# Patient Record
Sex: Male | Born: 1950 | Race: White | Hispanic: No | Marital: Single | Smoking: Never smoker
Health system: Southern US, Community
[De-identification: ages and names within clinical notes are randomized; demographics above are authoritative.]

## PROBLEM LIST (undated history)

## (undated) DIAGNOSIS — Z86010 Personal history of colonic polyps: Secondary | ICD-10-CM

## (undated) DIAGNOSIS — M1990 Unspecified osteoarthritis, unspecified site: Secondary | ICD-10-CM

## (undated) DIAGNOSIS — R05 Cough: Secondary | ICD-10-CM

## (undated) DIAGNOSIS — R059 Cough, unspecified: Secondary | ICD-10-CM

## (undated) DIAGNOSIS — K219 Gastro-esophageal reflux disease without esophagitis: Secondary | ICD-10-CM

## (undated) DIAGNOSIS — R0600 Dyspnea, unspecified: Secondary | ICD-10-CM

## (undated) HISTORY — DX: Cough, unspecified: R05.9

## (undated) HISTORY — DX: Personal history of colonic polyps: Z86.010

## (undated) HISTORY — DX: Unspecified osteoarthritis, unspecified site: M19.90

## (undated) HISTORY — DX: Dyspnea, unspecified: R06.00

## (undated) HISTORY — DX: Gastro-esophageal reflux disease without esophagitis: K21.9

## (undated) HISTORY — DX: Cough: R05

## (undated) HISTORY — PX: BACK SURGERY: SHX140

---

## 2009-03-07 ENCOUNTER — Emergency Department (HOSPITAL_BASED_OUTPATIENT_CLINIC_OR_DEPARTMENT_OTHER): Admission: EM | Admit: 2009-03-07 | Discharge: 2009-03-07 | Payer: Self-pay | Admitting: Emergency Medicine

## 2009-03-07 ENCOUNTER — Ambulatory Visit: Payer: Self-pay | Admitting: Diagnostic Radiology

## 2010-05-23 ENCOUNTER — Encounter (INDEPENDENT_AMBULATORY_CARE_PROVIDER_SITE_OTHER): Payer: Self-pay | Admitting: *Deleted

## 2010-05-23 LAB — CONVERTED CEMR LAB: PSA: 2.91 ng/mL (ref <=4.00)

## 2010-06-06 ENCOUNTER — Emergency Department (HOSPITAL_BASED_OUTPATIENT_CLINIC_OR_DEPARTMENT_OTHER)
Admission: EM | Admit: 2010-06-06 | Discharge: 2010-06-06 | Disposition: A | Discharge status: Home / Self Care | Payer: Self-pay | Attending: Emergency Medicine | Admitting: Emergency Medicine

## 2010-06-06 ENCOUNTER — Emergency Department (INDEPENDENT_AMBULATORY_CARE_PROVIDER_SITE_OTHER): Payer: Self-pay

## 2010-06-06 DIAGNOSIS — Y92410 Unspecified street and highway as the place of occurrence of the external cause: Secondary | ICD-10-CM | POA: Insufficient documentation

## 2010-06-06 DIAGNOSIS — R209 Unspecified disturbances of skin sensation: Secondary | ICD-10-CM | POA: Insufficient documentation

## 2010-06-06 DIAGNOSIS — E7800 Pure hypercholesterolemia, unspecified: Secondary | ICD-10-CM | POA: Insufficient documentation

## 2010-06-06 DIAGNOSIS — M542 Cervicalgia: Secondary | ICD-10-CM

## 2010-06-06 DIAGNOSIS — M47817 Spondylosis without myelopathy or radiculopathy, lumbosacral region: Secondary | ICD-10-CM | POA: Insufficient documentation

## 2010-06-06 DIAGNOSIS — M549 Dorsalgia, unspecified: Secondary | ICD-10-CM

## 2010-07-03 LAB — URINALYSIS, ROUTINE W REFLEX MICROSCOPIC
Bilirubin Urine: NEGATIVE
Glucose, UA: NEGATIVE mg/dL
Hgb urine dipstick: NEGATIVE
Ketones, ur: NEGATIVE mg/dL
Nitrite: NEGATIVE
Protein, ur: NEGATIVE mg/dL
Specific Gravity, Urine: 1.005 (ref 1.005–1.030)
Urobilinogen, UA: 0.2 mg/dL (ref 0.0–1.0)
pH: 6 (ref 5.0–8.0)

## 2010-08-24 ENCOUNTER — Emergency Department (INDEPENDENT_AMBULATORY_CARE_PROVIDER_SITE_OTHER): Payer: Self-pay

## 2010-08-24 ENCOUNTER — Emergency Department (HOSPITAL_BASED_OUTPATIENT_CLINIC_OR_DEPARTMENT_OTHER)
Admission: EM | Admit: 2010-08-24 | Discharge: 2010-08-24 | Disposition: A | Discharge status: Home / Self Care | Payer: Self-pay | Attending: Emergency Medicine | Admitting: Emergency Medicine

## 2010-08-24 DIAGNOSIS — R55 Syncope and collapse: Secondary | ICD-10-CM | POA: Insufficient documentation

## 2010-08-24 DIAGNOSIS — E7800 Pure hypercholesterolemia, unspecified: Secondary | ICD-10-CM | POA: Insufficient documentation

## 2010-08-24 DIAGNOSIS — G319 Degenerative disease of nervous system, unspecified: Secondary | ICD-10-CM | POA: Insufficient documentation

## 2010-08-24 DIAGNOSIS — H547 Unspecified visual loss: Secondary | ICD-10-CM

## 2010-08-24 DIAGNOSIS — R51 Headache: Secondary | ICD-10-CM

## 2010-08-24 DIAGNOSIS — K219 Gastro-esophageal reflux disease without esophagitis: Secondary | ICD-10-CM | POA: Insufficient documentation

## 2010-08-24 DIAGNOSIS — R42 Dizziness and giddiness: Secondary | ICD-10-CM | POA: Insufficient documentation

## 2010-08-24 LAB — GLUCOSE, CAPILLARY

## 2010-08-24 LAB — CBC
HCT: 42.7 % (ref 39.0–52.0)
Hemoglobin: 15.2 g/dL (ref 13.0–17.0)
MCV: 86.6 fL (ref 78.0–100.0)
WBC: 9.5 10*3/uL (ref 4.0–10.5)

## 2010-08-24 LAB — BASIC METABOLIC PANEL
BUN: 14 mg/dL (ref 6–23)
CO2: 25 mEq/L (ref 19–32)
Chloride: 102 mEq/L (ref 96–112)
Creatinine, Ser: 0.7 mg/dL (ref 0.4–1.5)
Glucose, Bld: 94 mg/dL (ref 70–99)
Potassium: 3.7 mEq/L (ref 3.5–5.1)

## 2010-08-24 LAB — DIFFERENTIAL
Basophils Absolute: 0 10*3/uL (ref 0.0–0.1)
Lymphocytes Relative: 23 % (ref 12–46)
Lymphs Abs: 2.2 10*3/uL (ref 0.7–4.0)
Neutro Abs: 6.2 10*3/uL (ref 1.7–7.7)

## 2011-03-30 ENCOUNTER — Emergency Department (HOSPITAL_BASED_OUTPATIENT_CLINIC_OR_DEPARTMENT_OTHER)
Admission: EM | Admit: 2011-03-30 | Discharge: 2011-03-30 | Disposition: A | Discharge status: Home / Self Care | Payer: Self-pay | Attending: Emergency Medicine | Admitting: Emergency Medicine

## 2011-03-30 ENCOUNTER — Encounter: Payer: Self-pay | Admitting: *Deleted

## 2011-03-30 DIAGNOSIS — K0889 Other specified disorders of teeth and supporting structures: Secondary | ICD-10-CM

## 2011-03-30 DIAGNOSIS — K089 Disorder of teeth and supporting structures, unspecified: Secondary | ICD-10-CM | POA: Insufficient documentation

## 2011-03-30 MED ORDER — PENICILLIN V POTASSIUM 250 MG PO TABS: 250.0000 mg | ORAL_TABLET | Freq: Four times a day (QID) | ORAL | Status: AC

## 2011-03-30 MED ORDER — HYDROCODONE-ACETAMINOPHEN 5-500 MG PO TABS: 1.0000 | ORAL_TABLET | Freq: Four times a day (QID) | ORAL | Status: AC | PRN

## 2011-03-30 NOTE — ED Notes (Signed)
Pt states  He has had dental pain x 4 weeks. Seen in [place]. Given Amox and Ibu. OK until last week and then s/s returned. Needs something until Jan 14th when he will have the tooth pulled.

## 2011-03-30 NOTE — ED Provider Notes (Signed)
History     CSN: [id number]  Arrival date & time 03/30/11  1315   First MD Initiated Contact with Patient 03/30/11 1416      Chief Complaint  Patient presents with  . Dental Pain    (Consider location/radiation/quality/duration/timing/severity/associated sxs/prior treatment) HPI Comments: Pt states that he had pain, and was treated but in the last week the pain started to come back  Patient is a 60 y.o. male presenting with tooth pain. The history is provided by the patient. No language interpreter was used.  Dental PainThe primary symptoms include mouth pain. The symptoms began more than 1 week ago. The symptoms are worsening. The symptoms are recurrent. The symptoms occur constantly.  Additional symptoms include: dental sensitivity to temperature. Additional symptoms do not include: gum swelling, facial swelling and trouble swallowing.    History reviewed. No pertinent past medical history.  History reviewed. No pertinent past surgical history.  History reviewed. No pertinent family history.  History  Substance Use Topics  . Smoking status: Never Smoker   . Smokeless tobacco: Not on file  . Alcohol Use: No      Review of Systems  Constitutional: Negative.   HENT: Negative for facial swelling and trouble swallowing.   Respiratory: Negative.   Cardiovascular: Negative.     Allergies  Review of patient's allergies indicates no known allergies.  Home Medications   Current Outpatient Rx  Name Route Sig Dispense Refill  . HYDROCODONE-ACETAMINOPHEN 5-500 MG PO TABS Oral Take 1-2 tablets by mouth every 6 (six) hours as needed for pain. 6 tablet 0  . PENICILLIN V POTASSIUM 250 MG PO TABS Oral Take 1 tablet (250 mg total) by mouth 4 (four) times daily. 40 tablet 0    BP 128/81  Pulse 103  Temp(Src) 97.4 F (36.3 C) (Oral)  Resp 18  Ht 5\' 11"  (1.803 m)  Wt 210 lb (95.255 kg)  BMI 29.29 kg/m2  SpO2 99%  Physical Exam  Nursing note and vitals  reviewed. Constitutional: He appears well-developed and well-nourished.  HENT:  Head: Normocephalic and atraumatic.  Right Ear: External ear normal.  Left Ear: External ear normal.       Pt has multiple decayed teeth without and gross swelling  Cardiovascular: Normal rate and regular rhythm.   Pulmonary/Chest: Effort normal and breath sounds normal.    ED Course  Procedures (including critical care time)  Labs Reviewed - No data to display No results found.   1. Toothache       MDM  Pt has a schedule appt on jan 14 with the dentist to have the tooth extracted        Teressa Lower, NP 03/30/11 1501

## 2011-03-30 NOTE — ED Notes (Signed)
rx x 2 for hydrocodone and penicillin given to pt

## 2011-03-31 NOTE — ED Provider Notes (Signed)
Medical screening examination/treatment/procedure(s) were performed by non-physician practitioner and as supervising physician I was immediately available for consultation/collaboration.  Kensington Rios, MD 03/31/11 2211 

## 2011-11-25 ENCOUNTER — Emergency Department (HOSPITAL_BASED_OUTPATIENT_CLINIC_OR_DEPARTMENT_OTHER)
Admission: EM | Admit: 2011-11-25 | Discharge: 2011-11-25 | Disposition: A | Discharge status: Home / Self Care | Payer: Self-pay | Attending: Emergency Medicine | Admitting: Emergency Medicine

## 2011-11-25 ENCOUNTER — Encounter (HOSPITAL_BASED_OUTPATIENT_CLINIC_OR_DEPARTMENT_OTHER): Payer: Self-pay | Admitting: *Deleted

## 2011-11-25 DIAGNOSIS — J9801 Acute bronchospasm: Secondary | ICD-10-CM | POA: Insufficient documentation

## 2011-11-25 MED ORDER — ALBUTEROL SULFATE HFA 108 (90 BASE) MCG/ACT IN AERS
2.0000 | INHALATION_SPRAY | RESPIRATORY_TRACT | Status: DC | PRN
Start: 2011-11-25 — End: 2011-11-25
  Administered 2011-11-25: 2 via RESPIRATORY_TRACT
  Filled 2011-11-25: qty 6.7

## 2011-11-25 NOTE — ED Provider Notes (Signed)
History     CSN: [id number]  Arrival date & time 11/25/11  1412   First MD Initiated Contact with Patient 11/25/11 1531      Chief Complaint  Patient presents with  . Cough    (Consider location/radiation/quality/duration/timing/severity/associated sxs/prior treatment) Patient is a 61 y.o. male presenting with cough. The history is provided by the patient.  Cough This is a chronic problem. The current episode started more than 1 week ago. The problem has not changed since onset.Associated symptoms include wheezing. Pertinent negatives include no chest pain, no sweats, no headaches and no shortness of breath. Associated symptoms comments: He reports wheezing every night over the last  2months with infrequent cough. It resolved during the day. It is not associated with chest pain. He does not report and shortness of breath. It happens when he is recumbent at night and he does not notice it during the day.. His past medical history does not include asthma.    History reviewed. No pertinent past medical history.  History reviewed. No pertinent past surgical history.  History reviewed. No pertinent family history.  History  Substance Use Topics  . Smoking status: Never Smoker   . Smokeless tobacco: Not on file  . Alcohol Use: No      Review of Systems  Constitutional: Negative for fever.  HENT: Negative for congestion.   Respiratory: Positive for cough and wheezing. Negative for shortness of breath.   Cardiovascular: Negative for chest pain.  Gastrointestinal: Negative for nausea and vomiting.  Skin: Negative for rash.  Neurological: Negative for headaches.    Allergies  Review of patient's allergies indicates no known allergies.  Home Medications  No current outpatient prescriptions on file.  BP 113/66  Pulse 84  Temp 98 F (36.7 C) (Oral)  Resp 18  Ht 6' (1.829 m)  Wt 228 lb (103.42 kg)  BMI 30.92 kg/m2  SpO2 98%  Physical Exam  Constitutional: He appears  well-developed and well-nourished.  HENT:  Head: Normocephalic.  Neck: Normal range of motion. Neck supple.  Cardiovascular: Normal rate and regular rhythm.   Pulmonary/Chest: Effort normal and breath sounds normal.  Abdominal: Soft. Bowel sounds are normal. There is no tenderness. There is no rebound and no guarding.  Musculoskeletal: Normal range of motion.  Neurological: He is alert. No cranial nerve deficit.  Skin: Skin is warm and dry. No rash noted.  Psychiatric: He has a normal mood and affect.    ED Course  Procedures (including critical care time)  Labs Reviewed - No data to display No results found.   No diagnosis found.  1. Bronchospasm   MDM  Patient without fever, pain, substantial cough with audible wheezing only at night - suspect uncomplicated bronchospasm. Will treat with inhaler, but discussed that he should return when symptomatic if symptoms persist.         Rodena Medin, PA-C 11/25/11 1546

## 2011-11-25 NOTE — ED Provider Notes (Signed)
Medical screening examination/treatment/procedure(s) were performed by non-physician practitioner and as supervising physician I was immediately available for consultation/collaboration.   Raya Mckinstry B. Gio Janoski, MD 11/25/11 1639 

## 2011-11-25 NOTE — ED Notes (Signed)
Pt amb to triage with quick steady gait in nad. Pt reports 4 months of intermittent cough, and feeling "wheezy and short of breath at night sometimes." denies any pain or other c/o.

## 2012-07-24 ENCOUNTER — Emergency Department (HOSPITAL_COMMUNITY)
Admission: EM | Admit: 2012-07-24 | Discharge: 2012-07-24 | Disposition: A | Discharge status: Home Health | Payer: Self-pay | Source: Home / Self Care | Attending: Family Medicine | Admitting: Family Medicine

## 2012-07-24 ENCOUNTER — Encounter (HOSPITAL_COMMUNITY): Payer: Self-pay

## 2012-07-24 DIAGNOSIS — M542 Cervicalgia: Secondary | ICD-10-CM

## 2012-07-24 DIAGNOSIS — M62838 Other muscle spasm: Secondary | ICD-10-CM | POA: Diagnosis present

## 2012-07-24 DIAGNOSIS — M5380 Other specified dorsopathies, site unspecified: Secondary | ICD-10-CM

## 2012-07-24 DIAGNOSIS — M62830 Muscle spasm of back: Secondary | ICD-10-CM | POA: Diagnosis present

## 2012-07-24 MED ORDER — CYCLOBENZAPRINE HCL 5 MG PO TABS: 5.0000 mg | ORAL_TABLET | Freq: Every evening | ORAL | Status: DC | PRN

## 2012-07-24 MED ORDER — TRAMADOL HCL 50 MG PO TABS: 50.0000 mg | ORAL_TABLET | Freq: Two times a day (BID) | ORAL | Status: DC | PRN

## 2012-07-24 MED ORDER — NAPROXEN 500 MG PO TABS: ORAL_TABLET | ORAL | Status: DC

## 2012-07-24 NOTE — ED Provider Notes (Signed)
History     CSN: [id number]  Arrival date & time 07/24/12  1554   First MD Initiated Contact with Patient 07/24/12 1634      Chief Complaint  Patient presents with  . Back Pain  . Neck Pain    (Consider location/radiation/quality/duration/timing/severity/associated sxs/prior treatment) HPI Pt reports that for the past several weeks he has been having pain in the back of the neck and shoulder and upper back.   Pt says that he has been having some discomfort in the hands in the morning but then it gets better after movement.   Pt has not been having problems in the lower back.  Pt has been taking Advil over the counter.    History reviewed. No pertinent past medical history.  History reviewed. No pertinent past surgical history.  No family history on file.  History  Substance Use Topics  . Smoking status: Never Smoker   . Smokeless tobacco: Not on file  . Alcohol Use: No    Review of Systems Constitutional: Negative.  HENT: Negative.  Respiratory: Negative.  Cardiovascular: Negative.  Gastrointestinal: Negative.  Endocrine: Negative.  Genitourinary: Negative.  Musculoskeletal: back spasms and back pain , joint pain in fingers  Skin: Negative.  Allergic/Immunologic: Negative.  Neurological: Negative.  Hematological: Negative.  Psychiatric/Behavioral: Negative.  All other systems reviewed and are negative  Allergies  Review of patient's allergies indicates no known allergies.  Home Medications  No current outpatient prescriptions on file.  BP 130/80  Pulse 63  Temp(Src) 97.8 F (36.6 C) (Oral)  Wt 247 lb (112.038 kg)  BMI 33.49 kg/m2  SpO2 98%  Physical Exam Nursing note and vitals reviewed.  Constitutional: He is oriented to person, place, and time. He appears well-developed and well-nourished. No distress.  Eyes: Conjunctivae and EOM are normal. Pupils are equal, round, and reactive to light.  Neck: Normal range of motion. Neck supple. No JVD present.  No thyromegaly present.  Cardiovascular: Normal rate, regular rhythm and normal heart sounds.  No murmur heard.  Pulmonary/Chest: Effort normal and breath sounds normal. No respiratory distress.  Abdominal: Soft. Bowel sounds are normal.  Musculoskeletal: tenderness of paraspinal muscles of back, no loss of motion noted, no abnormal curvature of the spine  Normal range of motion. He exhibits no edema.  Lymphadenopathy:  He has no cervical adenopathy.  Neurological: He is oriented to person, place, and time. Coordination normal.  Skin: Skin is warm and dry. No rash noted. No erythema. No pallor.  Psychiatric: He has a normal mood and affect. His behavior is normal. Judgment and thought content normal.   ED Course  Procedures (including critical care time)  Labs Reviewed - No data to display No results found.  No diagnosis found.  MDM  IMPRESSION  Back pain   Back spasm  Osteoarthritis   GERD  RECOMMENDATIONS / PLAN Early activity encouraged, avoid reinjury, trial of cyclobenzaprine 5 mg po every HS, tramadol 50 mg po every 12 hours prn severe pain, naproxen 500 mg every 12 hours prn severe pain  FOLLOW UP 3 weeks   The patient was given clear instructions to go to ER or return to medical center if symptoms don't improve, worsen or new problems develop.  The patient verbalized understanding.  The patient was told to call to get lab results if they haven't heard anything in the next week.           Cleora Fleet, MD 07/24/12 [phone]

## 2012-07-24 NOTE — ED Notes (Signed)
Patient states suffers from back pain-radiates down neck shoulders and lower back Denies any injury Had back surgery in the past

## 2012-09-08 ENCOUNTER — Ambulatory Visit: Payer: No Typology Code available for payment source | Attending: Family Medicine | Admitting: Internal Medicine

## 2012-09-08 VITALS — BP 120/80 | HR 82 | Temp 98.7°F | Resp 16 | Ht 70.0 in | Wt 244.0 lb

## 2012-09-08 DIAGNOSIS — M5380 Other specified dorsopathies, site unspecified: Secondary | ICD-10-CM

## 2012-09-08 DIAGNOSIS — M129 Arthropathy, unspecified: Secondary | ICD-10-CM

## 2012-09-08 DIAGNOSIS — M62838 Other muscle spasm: Secondary | ICD-10-CM

## 2012-09-08 DIAGNOSIS — M1990 Unspecified osteoarthritis, unspecified site: Secondary | ICD-10-CM | POA: Insufficient documentation

## 2012-09-08 DIAGNOSIS — M62830 Muscle spasm of back: Secondary | ICD-10-CM

## 2012-09-08 DIAGNOSIS — M542 Cervicalgia: Secondary | ICD-10-CM

## 2012-09-08 MED ORDER — OXYCODONE-ACETAMINOPHEN 5-325 MG PO TABS: 1.0000 | ORAL_TABLET | Freq: Three times a day (TID) | ORAL | Status: DC | PRN

## 2012-09-08 MED ORDER — CYCLOBENZAPRINE HCL 5 MG PO TABS: 5.0000 mg | ORAL_TABLET | Freq: Every evening | ORAL | Status: DC | PRN

## 2012-09-08 NOTE — Patient Instructions (Addendum)

## 2012-09-08 NOTE — Progress Notes (Signed)
Patient ID: Keith Bruce, male   DOB: Aug 14, 1950, 62 y.o.   MRN: [id number]  CC: Joint pain  HPI: 62 year old male with no significant past medical history other than back pain and muscle spasms related to back pain who presents to our clinic with complaints of pain in ankle and wrist joints, chronic for about 6 months now. Patient reports experiencing stiffness in the morning, pain in bilateral wrists, bilateral ankles, back and neck. Patient reports that with activity throughout the day this gets better. He takes tramadol with no symptomatic relief. He has a family history of arthritis. No chest pain, no shortness of breath, no palpitation. No abdominal pain, no nausea or vomiting.  No Known Allergies History reviewed. No pertinent past medical history. No current outpatient prescriptions on file prior to visit.   No current facility-administered medications on file prior to visit.   Family History  Problem Relation Age of Onset  . Heart disease Mother     Sister - arthritis, severe    . Kidney disease Father    History   Social History  . Marital Status: Divorced    Spouse Name: N/A    Number of Children: N/A  . Years of Education: N/A   Occupational History  . Not on file.   Social History Main Topics  . Smoking status: Never Smoker   . Smokeless tobacco: Not on file  . Alcohol Use: No  . Drug Use: No  . Sexually Active: Not on file   Other Topics Concern  . Not on file   Social History Narrative  . No narrative on file    Review of Systems  Constitutional: Negative for fever, chills, diaphoresis, activity change, appetite change and fatigue.  HENT: Negative for ear pain, nosebleeds, congestion, facial swelling, rhinorrhea, neck pain, neck stiffness and ear discharge.   Eyes: Negative for pain, discharge, redness, itching and visual disturbance.  Respiratory: Negative for cough, choking, chest tightness, shortness of breath, wheezing and stridor.   Cardiovascular:  Negative for chest pain, palpitations and leg swelling.  Gastrointestinal: Negative for abdominal distention.  Genitourinary: Negative for dysuria, urgency, frequency, hematuria, flank pain, decreased urine volume, difficulty urinating and dyspareunia.  Musculoskeletal: Positive for pain in joints, stiffness.  Neurological: Negative for dizziness, tremors, seizures, syncope, facial asymmetry, speech difficulty, weakness, light-headedness, numbness and headaches.  Hematological: Negative for adenopathy. Does not bruise/bleed easily.  Psychiatric/Behavioral: Negative for hallucinations, behavioral problems, confusion, dysphoric mood, decreased concentration and agitation.    Objective:   Filed Vitals:   09/08/12 1106  BP: 120/80  Pulse: 82  Temp: 98.7 F (37.1 C)  Resp: 16    Physical Exam  Constitutional: Appears well-developed and well-nourished. No distress.  HENT: Normocephalic. External right and left ear normal. Oropharynx is clear and moist.  Eyes: Conjunctivae and EOM are normal. PERRLA, no scleral icterus.  Neck: Normal ROM. Neck supple. No JVD. No tracheal deviation. No thyromegaly.  CVS: RRR, S1/S2 +, no murmurs, no gallops, no carotid bruit.  Pulmonary: Effort and breath sounds normal, no stridor, rhonchi, wheezes, rales.  Abdominal: Soft. BS +,  no distension, tenderness, rebound or guarding.  Musculoskeletal: Normal range of motion. No edema and no tenderness.  Lymphadenopathy: No lymphadenopathy noted, cervical, inguinal. Neuro: Alert. Normal reflexes, muscle tone coordination. No cranial nerve deficit. Skin: Skin is warm and dry. No rash noted. Not diaphoretic. No erythema. No pallor.  Psychiatric: Normal mood and affect. Behavior, judgment, thought content normal.   Lab Results  Component Value Date  WBC 9.5 08/24/2010   HGB 15.2 08/24/2010   HCT 42.7 08/24/2010   MCV 86.6 08/24/2010   PLT 222 08/24/2010   Lab Results  Component Value Date   CREATININE 0.70  08/24/2010   BUN 14 08/24/2010   NA 139 08/24/2010   K 3.7 08/24/2010   CL 102 08/24/2010   CO2 25 08/24/2010    No results found for this basename: HGBA1C   Lipid Panel  No results found for this basename: chol, trig, hdl, cholhdl, vldl, ldlcalc      Assessment and plan:   Patient Active Problem List   Diagnosis Date Noted  . Arthritis 09/08/2012    Priority: Medium - Patient reports tramadol provides only minimal symptomatic relief. We'll try Percocet and see if this improves symptoms   . Muscle spasm 07/24/2012    Priority: Medium - Continue Flexeril

## 2012-09-08 NOTE — Progress Notes (Signed)
Patient complains of bilateral hand pain in joints; stiffness and numbness at night; also pain and stiffness in ankles. Pt is concerned that he has arthritis.

## 2012-10-07 ENCOUNTER — Encounter: Payer: Self-pay | Admitting: Family Medicine

## 2012-10-07 ENCOUNTER — Ambulatory Visit: Payer: No Typology Code available for payment source | Attending: Family Medicine | Admitting: Family Medicine

## 2012-10-07 VITALS — BP 144/92 | HR 56 | Temp 98.7°F | Resp 16 | Ht 69.5 in | Wt 238.0 lb

## 2012-10-07 DIAGNOSIS — M25519 Pain in unspecified shoulder: Secondary | ICD-10-CM

## 2012-10-07 DIAGNOSIS — M5380 Other specified dorsopathies, site unspecified: Secondary | ICD-10-CM | POA: Insufficient documentation

## 2012-10-07 DIAGNOSIS — M129 Arthropathy, unspecified: Secondary | ICD-10-CM | POA: Insufficient documentation

## 2012-10-07 DIAGNOSIS — M542 Cervicalgia: Secondary | ICD-10-CM | POA: Insufficient documentation

## 2012-10-07 DIAGNOSIS — M25511 Pain in right shoulder: Secondary | ICD-10-CM

## 2012-10-07 DIAGNOSIS — Z0000 Encounter for general adult medical examination without abnormal findings: Secondary | ICD-10-CM

## 2012-10-07 DIAGNOSIS — E291 Testicular hypofunction: Secondary | ICD-10-CM

## 2012-10-07 LAB — POCT URINALYSIS DIPSTICK
Bilirubin, UA: NEGATIVE
Blood, UA: NEGATIVE
Glucose, UA: NEGATIVE
Ketones, UA: NEGATIVE
Leukocytes, UA: NEGATIVE
Nitrite, UA: NEGATIVE

## 2012-10-07 LAB — LIPID PANEL
Cholesterol: 236 mg/dL — ABNORMAL HIGH (ref 0–200)
Total CHOL/HDL Ratio: 4.9 Ratio
Triglycerides: 146 mg/dL (ref <150)

## 2012-10-07 LAB — CBC
HCT: 45.9 % (ref 39.0–52.0)
Hemoglobin: 15.9 g/dL (ref 13.0–17.0)
MCH: 30.2 pg (ref 26.0–34.0)
MCHC: 34.6 g/dL (ref 30.0–36.0)
MCV: 87.1 fL (ref 78.0–100.0)

## 2012-10-07 LAB — TESTOSTERONE: Testosterone: 577 ng/dL (ref 300–890)

## 2012-10-07 NOTE — Progress Notes (Signed)
Patient states he is here for yearly physical.

## 2012-10-07 NOTE — Patient Instructions (Addendum)
Shoulder Pain  The shoulder is the joint that connects your arm to your body. Muscles and band-like tissues that connect bones to muscles (tendons) hold the joint together. Shoulder pain is felt if an injury or medical problem affects one or more parts of the shoulder.  HOME CARE    Put ice on the sore area.   Put ice in a plastic bag.   Place a towel between your skin and the bag.   Leave the ice on for 15-20 minutes, 3-4 times a day for the first 2 days.   Stop using cold packs if they do not help with the pain.   If you were given something to keep your shoulder from moving (sling, shoulder immobilizer), wear it as told. Only take it off to shower or bathe.   Move your arm as little as possible, but keep your hand moving to prevent puffiness (swelling).   Squeeze a soft ball or foam pad as much as possible to help prevent swelling.   Take medicine as told by your doctor.  GET HELP RIGHT AWAY IF:    Your arm, hand, or fingers are numb or tingling.   Your arm, hand, or fingers are puffy (swollen), painful, or turn white or blue.   You have more pain.   You have progressing new pain in your arm, hand, or fingers.   Your hand or fingers get cold.   Your medicine does not help lessen your pain.  MAKE SURE YOU:    Understand these instructions.   Will watch your condition.   Will get help right away if you are not doing well or get worse.  Document Released: 09/04/2007 Document Revised: 12/11/2011 Document Reviewed: 09/30/2011  [employer] Patient Information 2014 [employer].

## 2012-10-07 NOTE — Progress Notes (Signed)
Patient ID: Keith Bruce, male   DOB: Jun 08, 1950, 62 y.o.   MRN: [id number]  CC: complete physical examination   HPI: Pt says that he has had some erection difficulties.  He has been having some problems with his right shoulder for the last 6 months and the medications haven't been helping much.  Pt says that he otherwise feels well.  He has not had a colonoscopy for about 10 years now.  TDAP is up to date.  Had one 3 years ago.   No Known Allergies History reviewed. No pertinent past medical history. Current Outpatient Prescriptions on File Prior to Visit  Medication Sig Dispense Refill  . cyclobenzaprine (FLEXERIL) 5 MG tablet Take 1 tablet (5 mg total) by mouth at bedtime as needed for muscle spasms.  30 tablet  3   No current facility-administered medications on file prior to visit.   Family History  Problem Relation Age of Onset  . Heart disease Mother   . Kidney disease Father    History   Social History  . Marital Status: Divorced    Spouse Name: N/A    Number of Children: N/A  . Years of Education: N/A   Occupational History  . Not on file.   Social History Main Topics  . Smoking status: Never Smoker   . Smokeless tobacco: Not on file  . Alcohol Use: No  . Drug Use: No  . Sexually Active: Not on file   Other Topics Concern  . Not on file   Social History Narrative  . No narrative on file    Review of Systems  Constitutional: Negative for fever, chills, diaphoresis, activity change, appetite change and fatigue.  HENT: Negative for ear pain, nosebleeds, congestion, facial swelling, rhinorrhea, neck pain, neck stiffness and ear discharge.   Eyes: Negative for pain, discharge, redness, itching and visual disturbance.  Respiratory: Negative for cough, choking, chest tightness, shortness of breath, wheezing and stridor.   Cardiovascular: Negative for chest pain, palpitations and leg swelling.  Gastrointestinal: Negative for abdominal distention.  Genitourinary:  Negative for dysuria, urgency, frequency, hematuria, flank pain, decreased urine volume, difficulty urinating and dyspareunia.  Musculoskeletal: Negative for back pain, joint swelling, arthralgias and gait problem.  Neurological: Negative for dizziness, tremors, seizures, syncope, facial asymmetry, speech difficulty, weakness, light-headedness, numbness and headaches.  Hematological: Negative for adenopathy. Does not bruise/bleed easily.  Psychiatric/Behavioral: Negative for hallucinations, behavioral problems, confusion, dysphoric mood, decreased concentration and agitation.    Objective:   Filed Vitals:   10/07/12 1238  BP: 144/92  Pulse: 56  Temp: 98.7 F (37.1 C)  Resp: 16    Physical Exam  Constitutional: Appears well-developed and well-nourished. No distress.  HENT: Normocephalic. External right and left ear normal. Oropharynx is clear and moist.  Eyes: Conjunctivae and EOM are normal. PERRLA, no scleral icterus.  Neck: Normal ROM. Neck supple. No JVD. No tracheal deviation. No thyromegaly.  CVS: RRR, S1/S2 +, no murmurs, no gallops, no carotid bruit.  Pulmonary: Effort and breath sounds normal, no stridor, rhonchi, wheezes, rales.  Abdominal: Soft. BS +,  no distension, tenderness, rebound or guarding. Mildy enlarged prostate.   Musculoskeletal: Normal range of motion. No edema and no tenderness.  Lymphadenopathy: No lymphadenopathy noted, cervical, inguinal. Neuro: Alert. Normal reflexes, muscle tone coordination. No cranial nerve deficit. Skin: Skin is warm and dry. No rash noted. Not diaphoretic. No erythema. No pallor.  Psychiatric: Normal mood and affect. Behavior, judgment, thought content normal.   Lab Results  Component Value  Date   WBC 9.5 08/24/2010   HGB 15.2 08/24/2010   HCT 42.7 08/24/2010   MCV 86.6 08/24/2010   PLT 222 08/24/2010   Lab Results  Component Value Date   CREATININE 0.70 08/24/2010   BUN 14 08/24/2010   NA 139 08/24/2010   K 3.7 08/24/2010   CL  102 08/24/2010   CO2 25 08/24/2010    No results found for this basename: HGBA1C   Lipid Panel  No results found for this basename: chol, trig, hdl, cholhdl, vldl, ldlcalc       Assessment and plan:   Patient Active Problem List   Diagnosis Date Noted  . Health maintenance examination 10/07/2012  . Hypogonadism male 10/07/2012  . Right shoulder pain 10/07/2012  . Arthritis 09/08/2012  . Muscle spasm 07/24/2012  . Back spasm 07/24/2012  . Neck pain 07/24/2012      CPE   Check testosterone levels  Check labs today  Follow lab results  Screening colonoscopy ordered.  Pt was sent home with hemoccult cards and asked to return to clinic.   Follow up in 6 months  The patient was given clear instructions to go to ER or return to medical center if symptoms don't improve, worsen or new problems develop.  The patient verbalized understanding.  The patient was told to call to get any lab results if not heard anything in the next week.    Rodney Langton, MD, CDE, FAAFP [facility] [place], [place]

## 2012-10-08 LAB — COMPLETE METABOLIC PANEL WITH GFR
Albumin: 4.6 g/dL (ref 3.5–5.2)
Alkaline Phosphatase: 107 U/L (ref 39–117)
BUN: 20 mg/dL (ref 6–23)
Creat: 0.92 mg/dL (ref 0.50–1.35)
GFR, Est Non African American: 89 mL/min
Glucose, Bld: 105 mg/dL — ABNORMAL HIGH (ref 70–99)
Potassium: 4.8 mEq/L (ref 3.5–5.3)

## 2012-10-09 ENCOUNTER — Telehealth: Payer: Self-pay

## 2012-10-09 NOTE — Telephone Encounter (Signed)
Left messages at both numbers to return our call

## 2012-10-09 NOTE — Progress Notes (Signed)
Quick Note:  Please inform patient that all of his labs came back okay septum his cholesterol levels were elevated. I'm recommending that he start a low-fat low-cholesterol diet and exercise 5 times per week. We should recheck his cholesterol levels in 3 or 4 months. If they're still elevated we may consider some cholesterol lowering medication at that point. His testosterone levels came back within normal limits.   Rodney Langton, MD, CDE, FAAFP [facility] [place], [place]   ______

## 2012-10-09 NOTE — Telephone Encounter (Signed)
Message copied by Lestine Mount on Fri Oct 09, 2012 10:52 AM ------      Message from: Cleora Fleet      Created: Fri Oct 09, 2012  9:27 AM       Please inform patient that all of his labs came back okay septum his cholesterol levels were elevated.  I'm recommending that he start a low-fat low-cholesterol diet and exercise 5 times per week.  We should recheck his cholesterol levels in 3 or 4 months.  If they're still elevated we may consider some cholesterol lowering medication at that point.  His testosterone levels came back within normal limits.             Keith Langton, MD, CDE, FAAFP      [facility]      [facility]      [place], [place]        ------

## 2012-10-12 ENCOUNTER — Telehealth: Payer: Self-pay | Admitting: Family Medicine

## 2012-10-12 NOTE — Telephone Encounter (Signed)
Patient is aware of his lab results 

## 2012-10-12 NOTE — Telephone Encounter (Signed)
Pt calling back about results .

## 2012-10-15 ENCOUNTER — Encounter: Payer: Self-pay | Admitting: Family Medicine

## 2012-10-15 ENCOUNTER — Ambulatory Visit (INDEPENDENT_AMBULATORY_CARE_PROVIDER_SITE_OTHER): Payer: No Typology Code available for payment source | Admitting: Family Medicine

## 2012-10-15 VITALS — BP 135/86 | HR 58 | Ht 70.0 in | Wt 240.0 lb

## 2012-10-15 DIAGNOSIS — M5010 Cervical disc disorder with radiculopathy, unspecified cervical region: Secondary | ICD-10-CM

## 2012-10-15 DIAGNOSIS — M542 Cervicalgia: Secondary | ICD-10-CM

## 2012-10-15 DIAGNOSIS — M5412 Radiculopathy, cervical region: Secondary | ICD-10-CM

## 2012-10-15 MED ORDER — DICLOFENAC SODIUM 75 MG PO TBEC: 75.0000 mg | DELAYED_RELEASE_TABLET | Freq: Two times a day (BID) | ORAL | Status: DC

## 2012-10-15 NOTE — Patient Instructions (Addendum)
You have cervical radiculopathy (a pinched nerve in the neck). Take voltaren twice a day with food for pain and inflammation. Consider prednisone dose pack for pinched nerve if not improving. Consider flexeril three times a day as needed for muscle spasms (can make you sleepy - if so do not drive while taking this). Consider Vicodin or percocet for severe pain (no driving on this medicine). Simple range of motion exercises within limits of pain to prevent further stiffness. Start physical therapy for stretching, exercises, traction, and modalities. Do home exercises on days you don't go to therapy. Heat 15 minutes at a time 3-4 times a day to help with spasms. Watch head position when on computers, texting, when sleeping in bed - should in line with back to prevent further nerve traction and irritation. Consider home traction unit if you get benefit with this in physical therapy. Follow up with me in 5-6 weeks for reevaluation.  For arthritis: Take tylenol 500mg  1-2 tabs three times a day for pain. Voltaren as above, Glucosamine sulfate 750mg  twice a day is a supplement that has been shown to help moderate to severe arthritis. Capsaicin topically up to four times a day may also help with pain. Cortisone injections are an option. It's important that you continue to stay active. Consider physical therapy to strengthen muscles around the joint that hurts to take pressure off of the joint itself. Heat or ice 15 minutes at a time 3-4 times a day as needed to help with pain.

## 2012-10-16 ENCOUNTER — Encounter: Payer: Self-pay | Admitting: Internal Medicine

## 2012-10-19 ENCOUNTER — Encounter: Payer: Self-pay | Admitting: Family Medicine

## 2012-10-19 NOTE — Progress Notes (Signed)
Patient ID: Keith Bruce, male   DOB: 09/27/1950, 62 y.o.   MRN: [id number]  PCP: No primary provider on file.  Subjective:   HPI: Patient is a 62 y.o. male here for right shoulder pain.  Patient reports for 3-4 years but worse past 6 months he has had right posterior shoulder pain. States it starts in neck and radiates into arm into right hand. Associated with numbness and hand tingling. Ibuprofen helps some. Tried prednisone dose pack. Has not done PT. Tried tramadol as well. No prior injury. No swelling. Has iced also. No bowel/bladder dysfunction.  History reviewed. No pertinent past medical history.  Current Outpatient Prescriptions on File Prior to Visit  Medication Sig Dispense Refill  . cyclobenzaprine (FLEXERIL) 5 MG tablet Take 1 tablet (5 mg total) by mouth at bedtime as needed for muscle spasms.  30 tablet  3   No current facility-administered medications on file prior to visit.    Past Surgical History  Procedure Laterality Date  . Back surgery      No Known Allergies  History   Social History  . Marital Status: Divorced    Spouse Name: N/A    Number of Children: N/A  . Years of Education: N/A   Occupational History  . Not on file.   Social History Main Topics  . Smoking status: Never Smoker   . Smokeless tobacco: Not on file  . Alcohol Use: No  . Drug Use: No  . Sexually Active: Not on file   Other Topics Concern  . Not on file   Social History Narrative  . No narrative on file    Family History  Problem Relation Age of Onset  . Heart disease Mother   . Kidney disease Father   . Sudden death Neg Hx   . Hypertension Neg Hx   . Hyperlipidemia Neg Hx   . Diabetes Neg Hx     BP 135/86  Pulse 58  Ht 5\' 10"  (1.778 m)  Wt 240 lb (108.863 kg)  BMI 34.44 kg/m2  Review of Systems: See HPI above.    Objective:  Physical Exam:  Gen: NAD  Neck: No gross deformity, swelling, bruising. TTP R cervical paraspinal region, trapezius.   No midline/bony TTP. FROM neck - pain on right lat rotation, extension. BUE strength 5/5.  Pain with triceps extension. Sensation intact to light touch currently.   2+ equal reflexes in triceps, biceps, brachioradialis tendons. Negative spurlings. NV intact distal BUEs.  R shoulder: No swelling, ecchymoses.  No gross deformity. No TTP. FROM without pain. Negative Hawkins, Neers. Strength 5/5 with empty can and resisted internal/external rotation.    Assessment & Plan:  1. Cervical radiculopathy - he would like to start with voltaren and formal physical therapy.  Consider prednisone, flexeril, norco as well.  Heat for spasms.  Ergonomic issues discussed.  F/u in 5-6 weeks.  If not improving consider dose pack again or MRI.

## 2012-10-19 NOTE — Assessment & Plan Note (Signed)
Cervical radiculopathy - he would like to start with voltaren and formal physical therapy.  Consider prednisone, flexeril, norco as well.  Heat for spasms.  Ergonomic issues discussed.  F/u in 5-6 weeks.  If not improving consider dose pack again or MRI.

## 2012-10-21 ENCOUNTER — Ambulatory Visit: Payer: No Typology Code available for payment source | Attending: Family Medicine | Admitting: Rehabilitation

## 2012-10-21 DIAGNOSIS — M542 Cervicalgia: Secondary | ICD-10-CM | POA: Insufficient documentation

## 2012-10-21 DIAGNOSIS — IMO0001 Reserved for inherently not codable concepts without codable children: Secondary | ICD-10-CM | POA: Insufficient documentation

## 2012-10-21 DIAGNOSIS — R209 Unspecified disturbances of skin sensation: Secondary | ICD-10-CM | POA: Insufficient documentation

## 2012-10-23 ENCOUNTER — Ambulatory Visit: Payer: No Typology Code available for payment source | Admitting: Rehabilitation

## 2012-10-26 ENCOUNTER — Ambulatory Visit: Payer: No Typology Code available for payment source | Admitting: Rehabilitation

## 2012-10-28 ENCOUNTER — Ambulatory Visit: Payer: No Typology Code available for payment source | Admitting: Rehabilitation

## 2012-10-30 ENCOUNTER — Ambulatory Visit: Payer: No Typology Code available for payment source | Admitting: Rehabilitation

## 2012-11-02 ENCOUNTER — Ambulatory Visit: Payer: No Typology Code available for payment source | Attending: Family Medicine | Admitting: Rehabilitation

## 2012-11-02 DIAGNOSIS — IMO0001 Reserved for inherently not codable concepts without codable children: Secondary | ICD-10-CM | POA: Insufficient documentation

## 2012-11-02 DIAGNOSIS — M542 Cervicalgia: Secondary | ICD-10-CM | POA: Insufficient documentation

## 2012-11-02 DIAGNOSIS — R209 Unspecified disturbances of skin sensation: Secondary | ICD-10-CM | POA: Insufficient documentation

## 2012-11-04 ENCOUNTER — Ambulatory Visit: Payer: No Typology Code available for payment source | Admitting: Rehabilitation

## 2012-11-06 ENCOUNTER — Ambulatory Visit: Payer: No Typology Code available for payment source | Admitting: Rehabilitation

## 2012-11-09 ENCOUNTER — Ambulatory Visit: Payer: No Typology Code available for payment source | Admitting: Rehabilitation

## 2012-11-11 ENCOUNTER — Other Ambulatory Visit: Payer: Self-pay | Admitting: *Deleted

## 2012-11-11 ENCOUNTER — Ambulatory Visit: Payer: No Typology Code available for payment source | Admitting: Rehabilitation

## 2012-11-11 DIAGNOSIS — Z0000 Encounter for general adult medical examination without abnormal findings: Secondary | ICD-10-CM

## 2012-11-11 LAB — GASTRIC OCCULT BLOOD (1-CARD TO LAB): Occult Blood, Gastric POC: NEGATIVE

## 2012-11-11 NOTE — Addendum Note (Signed)
Addended by: Earlie Lou on: 11/11/2012 11:21 AM   Modules accepted: Orders

## 2012-11-11 NOTE — Addendum Note (Signed)
Addended by: Earlie Lou on: 11/11/2012 11:24 AM   Modules accepted: Orders

## 2012-11-13 ENCOUNTER — Ambulatory Visit: Payer: No Typology Code available for payment source | Admitting: Rehabilitation

## 2012-11-16 ENCOUNTER — Ambulatory Visit: Payer: No Typology Code available for payment source | Admitting: Rehabilitation

## 2012-11-18 ENCOUNTER — Ambulatory Visit: Payer: No Typology Code available for payment source | Admitting: Rehabilitation

## 2012-11-19 ENCOUNTER — Ambulatory Visit: Payer: No Typology Code available for payment source | Admitting: Family Medicine

## 2012-11-20 ENCOUNTER — Ambulatory Visit: Payer: No Typology Code available for payment source | Admitting: Rehabilitation

## 2012-12-10 ENCOUNTER — Ambulatory Visit (AMBULATORY_SURGERY_CENTER): Payer: No Typology Code available for payment source | Admitting: *Deleted

## 2012-12-10 VITALS — Ht 70.0 in | Wt 249.0 lb

## 2012-12-10 DIAGNOSIS — Z1211 Encounter for screening for malignant neoplasm of colon: Secondary | ICD-10-CM

## 2012-12-10 MED ORDER — NA SULFATE-K SULFATE-MG SULF 17.5-3.13-1.6 GM/177ML PO SOLN: 1.0000 | Freq: Once | ORAL | Status: DC

## 2012-12-10 NOTE — Progress Notes (Signed)
Denies allergies to eggs or soy products. Denies complications with anesthesia or sedation. 

## 2012-12-24 ENCOUNTER — Encounter: Payer: Self-pay | Admitting: Internal Medicine

## 2012-12-24 ENCOUNTER — Ambulatory Visit (AMBULATORY_SURGERY_CENTER): Payer: No Typology Code available for payment source | Admitting: Internal Medicine

## 2012-12-24 VITALS — BP 105/76 | HR 55 | Temp 98.0°F | Resp 16 | Ht 70.0 in | Wt 249.0 lb

## 2012-12-24 DIAGNOSIS — D126 Benign neoplasm of colon, unspecified: Secondary | ICD-10-CM

## 2012-12-24 DIAGNOSIS — Z1211 Encounter for screening for malignant neoplasm of colon: Secondary | ICD-10-CM

## 2012-12-24 MED ORDER — SODIUM CHLORIDE 0.9 % IV SOLN: 500.0000 mL | INTRAVENOUS | Status: DC

## 2012-12-24 NOTE — Progress Notes (Signed)
Patient did not experience any of the following events: a burn prior to discharge; a fall within the facility; wrong site/side/patient/procedure/implant event; or a hospital transfer or hospital admission upon discharge from the facility. (G8907) Patient did not have preoperative order for IV antibiotic SSI prophylaxis. (G8918)  

## 2012-12-24 NOTE — Op Note (Signed)
[facility] [address] [place], [address]   COLONOSCOPY PROCEDURE REPORT  PATIENT: Keith, Bruce  MR#: [id number] BIRTHDATE: [birth year] , 62  yrs. old GENDER: Male ENDOSCOPIST: Iva Boop, MD, [facility] REFERRED AO:[id number] Laural Benes, M.D. PROCEDURE DATE:  12/24/2012 PROCEDURE:   Colonoscopy with snare polypectomy First Screening Colonoscopy - Avg.  risk and is 50 yrs.  old or older Yes.  Prior Negative Screening - Now for repeat screening. N/A  History of Adenoma - Now for follow-up colonoscopy & has been > or = to 3 yrs.  N/A  Polyps Removed Today? Yes. ASA CLASS:   Class II INDICATIONS:average risk screening and first colonoscopy. MEDICATIONS: propofol (Diprivan) 300mg  IV and These medications were titrated to patient response per physician's verbal order  DESCRIPTION OF PROCEDURE:   After the risks benefits and alternatives of the procedure were thoroughly explained, informed consent was obtained.  A digital rectal exam revealed no abnormalities of the rectum, A digital rectal exam revealed no prostatic nodules, and A digital rectal exam revealed the prostate was not enlarged.   The [id number]  endoscope was introduced through the anus and advanced to the cecum, which was identified by both the appendix and ileocecal valve. No adverse events experienced.   The quality of the prep was excellent using Suprep  The instrument was then slowly withdrawn as the colon was fully examined.      COLON FINDINGS: A sessile polyp measuring 5 mm in size was found in the transverse colon.  A polypectomy was performed with a cold snare.  The resection was complete and the polyp tissue was completely retrieved.   The colon mucosa was otherwise normal.   A right colon retroflexion was performed.  Retroflexed views revealed no abnormalities. The time to cecum=1 minutes 41 seconds. Withdrawal time=8 minutes 45 seconds.  The scope was withdrawn and the procedure  completed. COMPLICATIONS: There were no complications.  ENDOSCOPIC IMPRESSION: 1.   Sessile polyp measuring 5 mm in size was found in the transverse colon; polypectomy was performed with a cold snare 2.   The colon mucosa was otherwise normal - excellent prep - first colonoscopy  RECOMMENDATIONS: Timing of repeat colonoscopy will be determined by pathology findings.   eSigned:  Iva Boop, MD, [facility] 12/24/2012 12:48 PM  cc: The Patient and Standley Dakins MD

## 2012-12-24 NOTE — Progress Notes (Signed)
Called to room to assist during endoscopic procedure.  Patient ID and intended procedure confirmed with present staff. Received instructions for my participation in the procedure from the performing physician.  

## 2012-12-24 NOTE — Patient Instructions (Addendum)
I found and removed one small polyp that looks benign.  I will let you know pathology results and when to have another routine colonoscopy by mail.  I appreciate the opportunity to care for you. Iva Boop, MD, FACG  YOU HAD AN ENDOSCOPIC PROCEDURE TODAY AT THE [facility]: Refer to the procedure report that was given to you for any specific questions about what was found during the examination.  If the procedure report does not answer your questions, please call your gastroenterologist to clarify.  If you requested that your care partner not be given the details of your procedure findings, then the procedure report has been included in a sealed envelope for you to review at your convenience later.  YOU SHOULD EXPECT: Some feelings of bloating in the abdomen. Passage of more gas than usual.  Walking can help get rid of the air that was put into your GI tract during the procedure and reduce the bloating. If you had a lower endoscopy (such as a colonoscopy or flexible sigmoidoscopy) you may notice spotting of blood in your stool or on the toilet paper. If you underwent a bowel prep for your procedure, then you may not have a normal bowel movement for a few days.  DIET: Your first meal following the procedure should be a light meal and then it is ok to progress to your normal diet.  A half-sandwich or bowl of soup is an example of a good first meal.  Heavy or fried foods are harder to digest and may make you feel nauseous or bloated.  Likewise meals heavy in dairy and vegetables can cause extra gas to form and this can also increase the bloating.  Drink plenty of fluids but you should avoid alcoholic beverages for 24 hours.  ACTIVITY: Your care partner should take you home directly after the procedure.  You should plan to take it easy, moving slowly for the rest of the day.  You can resume normal activity the day after the procedure however you should NOT DRIVE or use heavy machinery for  24 hours (because of the sedation medicines used during the test).    SYMPTOMS TO REPORT IMMEDIATELY: A gastroenterologist can be reached at any hour.  During normal business hours, 8:30 AM to 5:00 PM Monday through Friday, call [phone].  After hours and on weekends, please call the GI answering service at [phone] who will take a message and have the physician on call contact you.   Following lower endoscopy (colonoscopy or flexible sigmoidoscopy):  Excessive amounts of blood in the stool  Significant tenderness or worsening of abdominal pains  Swelling of the abdomen that is new, acute  Fever of 100F or higher  FOLLOW UP: If any biopsies were taken you will be contacted by phone or by letter within the next 1-3 weeks.  Call your gastroenterologist if you have not heard about the biopsies in 3 weeks.  Our staff will call the home number listed on your records the next business day following your procedure to check on you and address any questions or concerns that you may have at that time regarding the information given to you following your procedure. This is a courtesy call and so if there is no answer at the home number and we have not heard from you through the emergency physician on call, we will assume that you have returned to your regular daily activities without incident.  SIGNATURES/CONFIDENTIALITY: You and/or your care partner have  signed paperwork which will be entered into your electronic medical record.  These signatures attest to the fact that that the information above on your After Visit Summary has been reviewed and is understood.  Full responsibility of the confidentiality of this discharge information lies with you and/or your care-partner.  Polyp-handout given  Repeat colonoscopy will be determined by pathology.

## 2012-12-25 ENCOUNTER — Telehealth: Payer: Self-pay | Admitting: *Deleted

## 2012-12-25 NOTE — Telephone Encounter (Signed)
  Follow up Call-  Call back number 12/24/2012  Post procedure Call Back phone  # [phone]  Permission to leave phone message Yes     Patient questions:  Do you have a fever, pain , or abdominal swelling? no Pain Score  0 *  Have you tolerated food without any problems? yes  Have you been able to return to your normal activities? yes  Do you have any questions about your discharge instructions: Diet   no Medications  no Follow up visit  no  Do you have questions or concerns about your Care? no  Actions: * If pain score is 4 or above: No action needed, pain <4.

## 2012-12-29 ENCOUNTER — Encounter: Payer: Self-pay | Admitting: Internal Medicine

## 2012-12-29 DIAGNOSIS — Z860100 Personal history of colon polyps, unspecified: Secondary | ICD-10-CM

## 2012-12-29 HISTORY — DX: Personal history of colon polyps, unspecified: Z86.0100

## 2012-12-29 HISTORY — DX: Personal history of colonic polyps: Z86.010

## 2012-12-29 NOTE — Progress Notes (Signed)
Quick Note:  5 mm adenoma repeat colon 5 years 2019 ______

## 2013-01-28 ENCOUNTER — Ambulatory Visit: Payer: No Typology Code available for payment source | Attending: Internal Medicine | Admitting: Internal Medicine

## 2013-01-28 VITALS — BP 132/87 | HR 57 | Temp 97.6°F | Resp 16 | Ht 70.0 in | Wt 246.0 lb

## 2013-01-28 DIAGNOSIS — K219 Gastro-esophageal reflux disease without esophagitis: Secondary | ICD-10-CM | POA: Insufficient documentation

## 2013-01-28 DIAGNOSIS — M62830 Muscle spasm of back: Secondary | ICD-10-CM

## 2013-01-28 DIAGNOSIS — Z0000 Encounter for general adult medical examination without abnormal findings: Secondary | ICD-10-CM

## 2013-01-28 DIAGNOSIS — E785 Hyperlipidemia, unspecified: Secondary | ICD-10-CM | POA: Insufficient documentation

## 2013-01-28 DIAGNOSIS — M1990 Unspecified osteoarthritis, unspecified site: Secondary | ICD-10-CM

## 2013-01-28 MED ORDER — OMEPRAZOLE 20 MG PO CPDR: 20.0000 mg | DELAYED_RELEASE_CAPSULE | Freq: Two times a day (BID) | ORAL | Status: DC

## 2013-01-28 MED ORDER — SUCRALFATE 1 G PO TABS: 1.0000 g | ORAL_TABLET | Freq: Three times a day (TID) | ORAL | Status: DC

## 2013-01-28 MED ORDER — SUCRALFATE 1 GM/10ML PO SUSP: 1.0000 g | Freq: Three times a day (TID) | ORAL | Status: DC

## 2013-01-28 NOTE — Progress Notes (Unsigned)
Pt is has been dealing with GERD for over 20 years. Recently for the past 2 months its become unbearable. Pt is concerned with a mole on his back.

## 2013-01-28 NOTE — Progress Notes (Unsigned)
Patient ID: Keith Bruce, male   DOB: 05-Jul-1950, 62 y.o.   MRN: [id number] Patient Demographics  Keith Bruce, is a 62 y.o. male  WUJ:[id number]  [id number]:[id number]  DOB - 25-Mar-1951  Chief Complaint  Patient presents with  . Follow-up        Subjective:   Keith Bruce today is here for a follow up visit.   Patient is a 62 year old male with history of GERD, presented to the clinic for followup, states that acid reflux has been bothering him significantly after the meals and at bedtime. He gets coughing spasm because of the burning sensation  and the acid brash in the mouth. He has been using over-the-counter Zantac and has not been helping much now. He is not taking any diclofenac anymore He follows Dr. Leone Payor, had a screening coloscopy done last month. Patient has No headache, No chest pain, No abdominal pain - No Nausea, No new weakness tingling or numbness, No Cough - SOB.   Objective:    Filed Vitals:   01/28/13 1235  BP: 132/87  Pulse: 57  Temp: 97.6 F (36.4 C)  TempSrc: Oral  Resp: 16  Height: 5\' 10"  (1.778 m)  Weight: 246 lb (111.585 kg)  SpO2: 95%     ALLERGIES:  No Known Allergies  PAST MEDICAL HISTORY: Past Medical History  Diagnosis Date  . Arthritis   . GERD (gastroesophageal reflux disease)   . Personal history of colonic adenoma 12/29/2012    MEDICATIONS AT HOME: Prior to Admission medications   Medication Sig Start Date End Date Taking? Authorizing Provider  cyclobenzaprine (FLEXERIL) 5 MG tablet Take 1 tablet (5 mg total) by mouth at bedtime as needed for muscle spasms. 09/08/12   Alison Murray, MD  omeprazole (PRILOSEC) 20 MG capsule Take 1 capsule (20 mg total) by mouth 2 (two) times daily. 01/28/13   Ripudeep Jenna Luo, MD  sucralfate (CARAFATE) 1 GM/10ML suspension Take 10 mLs (1 g total) by mouth 4 (four) times daily -  with meals and at bedtime. 01/28/13   Ripudeep Jenna Luo, MD     Exam  General appearance :Awake, alert, NAD, Speech Clear.   HEENT: Atraumatic and Normocephalic, PERLA Neck: supple, no JVD. No cervical lymphadenopathy.  Chest: Clear to auscultation bilaterally, no wheezing, rales or rhonchi CVS: S1 S2 regular, no murmurs.  Abdomen: soft, NBS, NT, ND, no gaurding, rigidity or rebound. Extremities: no cyanosis or clubbing, B/L Lower Ext shows no edema Neurology: Awake alert, and oriented X 3, CN II-XII intact, Non focal Skin: No Rash or lesions, mole on the back Wounds:N/A    Data Review   Basic Metabolic Panel: No results found for this basename: NA, K, CL, CO2, GLUCOSE, BUN, CREATININE, CALCIUM, MG, PHOS,  in the last 168 hours Liver Function Tests: No results found for this basename: AST, ALT, ALKPHOS, BILITOT, PROT, ALBUMIN,  in the last 168 hours  CBC: No results found for this basename: WBC, NEUTROABS, HGB, HCT, MCV, PLT,  in the last 168 hours  ------------------------------------------------------------------------------------------------------------------ No results found for this basename: HGBA1C,  in the last 72 hours ------------------------------------------------------------------------------------------------------------------ No results found for this basename: CHOL, HDL, LDLCALC, TRIG, CHOLHDL, LDLDIRECT,  in the last 72 hours ------------------------------------------------------------------------------------------------------------------ No results found for this basename: TSH, T4TOTAL, FREET3, T3FREE, THYROIDAB,  in the last 72 hours ------------------------------------------------------------------------------------------------------------------ No results found for this basename: VITAMINB12, FOLATE, FERRITIN, TIBC, IRON, RETICCTPCT,  in the last 72 hours  Coagulation profile  No results found for this basename: INR, PROTIME,  in the last 168 hours    Assessment & Plan   Active Problems: GERD - Place on PPI 20 mg twice a day, Carafate 1 g QID with meals and at bedtime -  Recheck in 3 weeks to one month, if no significant improvement,  will need endoscopy   Hyperlipidemia cholesterol 236, LDL 159 and 09/2012 - Patient does not want to be on any statins, he is changing his lifestyle  Mole on the back - Likely actinic keratosis, ambulatory referral to dermatology for biopsy   Follow-up in one month     RAI,RIPUDEEP M.D. 01/28/2013, 12:39 PM

## 2013-03-04 ENCOUNTER — Ambulatory Visit: Payer: No Typology Code available for payment source | Attending: Internal Medicine | Admitting: Internal Medicine

## 2013-03-04 ENCOUNTER — Encounter: Payer: Self-pay | Admitting: Internal Medicine

## 2013-03-04 VITALS — BP 143/92 | HR 65 | Temp 97.9°F | Resp 16 | Ht 71.0 in | Wt 252.0 lb

## 2013-03-04 DIAGNOSIS — K219 Gastro-esophageal reflux disease without esophagitis: Secondary | ICD-10-CM | POA: Insufficient documentation

## 2013-03-04 DIAGNOSIS — R05 Cough: Secondary | ICD-10-CM | POA: Insufficient documentation

## 2013-03-04 MED ORDER — SUCRALFATE 1 G PO TABS: 1.0000 g | ORAL_TABLET | Freq: Three times a day (TID) | ORAL | Status: DC

## 2013-03-04 MED ORDER — OMEPRAZOLE 40 MG PO CPDR: 40.0000 mg | DELAYED_RELEASE_CAPSULE | Freq: Every day | ORAL | Status: DC
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
## 2013-03-04 NOTE — Patient Instructions (Signed)
Gastroesophageal Reflux Disease, Adult  Gastroesophageal reflux disease (GERD) happens when acid from your stomach flows up into the esophagus. When acid comes in contact with the esophagus, the acid causes soreness (inflammation) in the esophagus. Over time, GERD may create small holes (ulcers) in the lining of the esophagus.  CAUSES   · Increased body weight. This puts pressure on the stomach, making acid rise from the stomach into the esophagus.  · Smoking. This increases acid production in the stomach.  · Drinking alcohol. This causes decreased pressure in the lower esophageal sphincter (valve or ring of muscle between the esophagus and stomach), allowing acid from the stomach into the esophagus.  · Late evening meals and a full stomach. This increases pressure and acid production in the stomach.  · A malformed lower esophageal sphincter.  Sometimes, no cause is found.  SYMPTOMS   · Burning pain in the lower part of the mid-chest behind the breastbone and in the mid-stomach area. This may occur twice a week or more often.  · Trouble swallowing.  · Sore throat.  · Dry cough.  · Asthma-like symptoms including chest tightness, shortness of breath, or wheezing.  DIAGNOSIS   Your caregiver may be able to diagnose GERD based on your symptoms. In some cases, X-rays and other tests may be done to check for complications or to check the condition of your stomach and esophagus.  TREATMENT   Your caregiver may recommend over-the-counter or prescription medicines to help decrease acid production. Ask your caregiver before starting or adding any new medicines.   HOME CARE INSTRUCTIONS   · Change the factors that you can control. Ask your caregiver for guidance concerning weight loss, quitting smoking, and alcohol consumption.  · Avoid foods and drinks that make your symptoms worse, such as:  · Caffeine or alcoholic drinks.  · Chocolate.  · Peppermint or mint flavorings.  · Garlic and onions.  · Spicy foods.  · Citrus fruits,  such as oranges, lemons, or limes.  · Tomato-based foods such as sauce, chili, salsa, and pizza.  · Fried and fatty foods.  · Avoid lying down for the 3 hours prior to your bedtime or prior to taking a nap.  · Eat small, frequent meals instead of large meals.  · Wear loose-fitting clothing. Do not wear anything tight around your waist that causes pressure on your stomach.  · Raise the head of your bed 6 to 8 inches with wood blocks to help you sleep. Extra pillows will not help.  · Only take over-the-counter or prescription medicines for pain, discomfort, or fever as directed by your caregiver.  · Do not take aspirin, ibuprofen, or other nonsteroidal anti-inflammatory drugs (NSAIDs).  SEEK IMMEDIATE MEDICAL CARE IF:   · You have pain in your arms, neck, jaw, teeth, or back.  · Your pain increases or changes in intensity or duration.  · You develop nausea, vomiting, or sweating (diaphoresis).  · You develop shortness of breath, or you faint.  · Your vomit is green, yellow, black, or looks like coffee grounds or blood.  · Your stool is red, bloody, or black.  These symptoms could be signs of other problems, such as heart disease, gastric bleeding, or esophageal bleeding.  MAKE SURE YOU:   · Understand these instructions.  · Will watch your condition.  · Will get help right away if you are not doing well or get worse.  Document Released: 12/26/2004 Document Revised: 06/10/2011 Document Reviewed: 10/05/2010  [employer]® Patient   Information ©2014 [employer].

## 2013-03-04 NOTE — Progress Notes (Signed)
Patient ID: Keith Bruce, male   DOB: 05-Feb-1951, 62 y.o.   MRN: [id number] Patient Demographics  Keith Bruce, is a 62 y.o. male  WUJ:[id number]  [id number]  DOB - [birth year]  Chief Complaint  Patient presents with  . Follow-up        Subjective:   Keith Bruce is a 62 y.o. male here today for a follow up visit. Patient has gastroesophageal reflux. Still have the same symptoms, now with night cough that sometimes disturbs his sleep. He has not had an upper endoscopy before. No weight loss. No vomiting. Patient has No headache, No chest pain, - No Nausea, No new weakness tingling or numbness, No Cough - SOB.  ALLERGIES: No Known Allergies  PAST MEDICAL HISTORY: Past Medical History  Diagnosis Date  . Arthritis   . GERD (gastroesophageal reflux disease)   . Personal history of colonic adenoma 12/29/2012    MEDICATIONS AT HOME: Prior to Admission medications   Medication Sig Start Date End Date Taking? Authorizing Provider  omeprazole (PRILOSEC) 40 MG capsule Take 1 capsule (40 mg total) by mouth at bedtime. 03/04/13  Yes Jeanann Lewandowsky, MD  sucralfate (CARAFATE) 1 G tablet Take 1 tablet (1 g total) by mouth 4 (four) times daily -  before meals and at bedtime. 03/04/13  Yes Jeanann Lewandowsky, MD  cyclobenzaprine (FLEXERIL) 5 MG tablet Take 1 tablet (5 mg total) by mouth at bedtime as needed for muscle spasms. 09/08/12   Alison Murray, MD     Objective:   Filed Vitals:   03/04/13 0925  BP: 143/92  Pulse: 65  Temp: 97.9 F (36.6 C)  TempSrc: Oral  Resp: 16  Height: 5\' 11"  (1.803 m)  Weight: 252 lb (114.306 kg)  SpO2: 96%    Exam General appearance : Awake, alert, not in any distress. Speech Clear. Not toxic looking HEENT: Atraumatic and Normocephalic, pupils equally reactive to light and accomodation Neck: supple, no JVD. No cervical lymphadenopathy.  Chest:Good air entry bilaterally, no added sounds  CVS: S1 S2 regular, no murmurs.  Abdomen: Bowel sounds  present, Non tender and not distended with no gaurding, rigidity or rebound. Extremities: B/L Lower Ext shows no edema, both legs are warm to touch Neurology: Awake alert, and oriented X 3, CN II-XII intact, Non focal Skin:No Rash Wounds:N/A   Data Review   CBC No results found for this basename: WBC, HGB, HCT, PLT, MCV, MCH, MCHC, RDW, NEUTRABS, LYMPHSABS, MONOABS, EOSABS, BASOSABS, BANDABS, BANDSABD,  in the last 168 hours  Chemistries   No results found for this basename: NA, K, CL, CO2, GLUCOSE, BUN, CREATININE, GFRCGP, CALCIUM, MG, AST, ALT, ALKPHOS, BILITOT,  in the last 168 hours ------------------------------------------------------------------------------------------------------------------ No results found for this basename: HGBA1C,  in the last 72 hours ------------------------------------------------------------------------------------------------------------------ No results found for this basename: CHOL, HDL, LDLCALC, TRIG, CHOLHDL, LDLDIRECT,  in the last 72 hours ------------------------------------------------------------------------------------------------------------------ No results found for this basename: TSH, T4TOTAL, FREET3, T3FREE, THYROIDAB,  in the last 72 hours ------------------------------------------------------------------------------------------------------------------ No results found for this basename: VITAMINB12, FOLATE, FERRITIN, TIBC, IRON, RETICCTPCT,  in the last 72 hours  Coagulation profile  No results found for this basename: INR, PROTIME,  in the last 168 hours    Assessment & Plan   1. GERD (gastroesophageal reflux disease) Increase omeprazole to 40 mg tablet by mouth, take it at bedtime instead of in the morning - Omeprazole (PRILOSEC) 40 MG capsule; Take 1 capsule (40 mg total) by mouth at bedtime.  Dispense: 90 capsule; Refill: 3 -  Sucralfate (CARAFATE) 1 G tablet; Take 1 tablet (1 g total) by mouth 4 (four) times daily-before meals  and at bedtime. Dispense: 120 tablet; Refill: 1 If symptoms persist after a trial of PPI, we will send patient for upper endoscopy Patient has been counseled about nutrition and exercise Patient was counseled about eating early in the evening, give enough time for her foot to digest before going to bed. Avoid caffeinated food and drink.   2. Cough  - DG Chest 2 View; Future  Follow up in 3 months or when necessary   The patient was given clear instructions to go to ER or return to medical center if symptoms don't improve, worsen or new problems develop. The patient verbalized understanding. The patient was told to call to get lab results if they haven't heard anything in the next week.    Jeanann Lewandowsky, MD, MHA, FACP, FAAP [facility] [place], [place] [phone]   03/04/2013, 10:22 AM

## 2013-03-05 ENCOUNTER — Ambulatory Visit (HOSPITAL_COMMUNITY)
Admission: RE | Admit: 2013-03-05 | Discharge: 2013-03-05 | Disposition: A | Discharge status: Home / Self Care | Payer: No Typology Code available for payment source | Source: Ambulatory Visit | Attending: Internal Medicine | Admitting: Internal Medicine

## 2013-03-05 DIAGNOSIS — K219 Gastro-esophageal reflux disease without esophagitis: Secondary | ICD-10-CM | POA: Insufficient documentation

## 2013-03-05 DIAGNOSIS — R05 Cough: Secondary | ICD-10-CM

## 2013-03-16 ENCOUNTER — Telehealth: Payer: Self-pay | Admitting: *Deleted

## 2013-03-16 ENCOUNTER — Telehealth: Payer: Self-pay | Admitting: Emergency Medicine

## 2013-03-16 NOTE — Telephone Encounter (Signed)
Left message for pt to call clinic

## 2013-03-16 NOTE — Telephone Encounter (Signed)
Contacted pt to inform him that his chest x-ray results are normal. Left a voicemail with this information and a contact number for [place].

## 2013-03-16 NOTE — Telephone Encounter (Signed)
Message copied by Darlis Loan on Tue Mar 16, 2013  2:59 PM ------      Message from: Quentin Angst      Created: Tue Mar 16, 2013  1:46 PM       Please inform patient that his chest x-ray is normal ------

## 2013-05-03 ENCOUNTER — Ambulatory Visit: Payer: No Typology Code available for payment source | Attending: Internal Medicine

## 2013-05-07 ENCOUNTER — Emergency Department (HOSPITAL_BASED_OUTPATIENT_CLINIC_OR_DEPARTMENT_OTHER)
Admission: EM | Admit: 2013-05-07 | Discharge: 2013-05-07 | Disposition: A | Discharge status: Home / Self Care | Payer: No Typology Code available for payment source | Attending: Emergency Medicine | Admitting: Emergency Medicine

## 2013-05-07 ENCOUNTER — Encounter (HOSPITAL_BASED_OUTPATIENT_CLINIC_OR_DEPARTMENT_OTHER): Payer: Self-pay | Admitting: Emergency Medicine

## 2013-05-07 ENCOUNTER — Emergency Department (HOSPITAL_BASED_OUTPATIENT_CLINIC_OR_DEPARTMENT_OTHER): Payer: No Typology Code available for payment source

## 2013-05-07 DIAGNOSIS — Z8739 Personal history of other diseases of the musculoskeletal system and connective tissue: Secondary | ICD-10-CM | POA: Insufficient documentation

## 2013-05-07 DIAGNOSIS — R0602 Shortness of breath: Secondary | ICD-10-CM | POA: Insufficient documentation

## 2013-05-07 DIAGNOSIS — Z79899 Other long term (current) drug therapy: Secondary | ICD-10-CM | POA: Insufficient documentation

## 2013-05-07 DIAGNOSIS — K219 Gastro-esophageal reflux disease without esophagitis: Secondary | ICD-10-CM | POA: Insufficient documentation

## 2013-05-07 DIAGNOSIS — Z860100 Personal history of colon polyps, unspecified: Secondary | ICD-10-CM | POA: Insufficient documentation

## 2013-05-07 LAB — CBC WITH DIFFERENTIAL/PLATELET
BASOS ABS: 0 10*3/uL (ref 0.0–0.1)
Basophils Relative: 0 % (ref 0–1)
EOS ABS: 0.3 10*3/uL (ref 0.0–0.7)
Eosinophils Relative: 4 % (ref 0–5)
HCT: 44.4 % (ref 39.0–52.0)
Hemoglobin: 15.2 g/dL (ref 13.0–17.0)
LYMPHS PCT: 28 % (ref 12–46)
Lymphs Abs: 2.2 10*3/uL (ref 0.7–4.0)
MCH: 30.9 pg (ref 26.0–34.0)
MCHC: 34.2 g/dL (ref 30.0–36.0)
MCV: 90.2 fL (ref 78.0–100.0)
MONO ABS: 0.8 10*3/uL (ref 0.1–1.0)
Monocytes Relative: 10 % (ref 3–12)
Neutro Abs: 4.5 10*3/uL (ref 1.7–7.7)
Neutrophils Relative %: 58 % (ref 43–77)
Platelets: 218 10*3/uL (ref 150–400)
RBC: 4.92 MIL/uL (ref 4.22–5.81)
RDW: 12.2 % (ref 11.5–15.5)
WBC: 7.8 10*3/uL (ref 4.0–10.5)

## 2013-05-07 LAB — BASIC METABOLIC PANEL
BUN: 15 mg/dL (ref 6–23)
CO2: 24 meq/L (ref 19–32)
CREATININE: 0.9 mg/dL (ref 0.50–1.35)
Calcium: 9.1 mg/dL (ref 8.4–10.5)
Chloride: 103 mEq/L (ref 96–112)
GFR calc Af Amer: >90 mL/min (ref >90)
GFR, EST NON AFRICAN AMERICAN: 89 mL/min — AB (ref >90)
Glucose, Bld: 103 mg/dL — ABNORMAL HIGH (ref 70–99)
Potassium: 4.2 mEq/L (ref 3.7–5.3)
Sodium: 140 mEq/L (ref 137–147)

## 2013-05-07 LAB — POCT I-STAT, CHEM 8
BUN: 15 mg/dL (ref 6–23)
CREATININE: 0.9 mg/dL (ref 0.50–1.35)
Calcium, Ion: 1.26 mmol/L (ref 1.13–1.30)
Chloride: 103 mEq/L (ref 96–112)
Glucose, Bld: 102 mg/dL — ABNORMAL HIGH (ref 70–99)
HCT: 46 % (ref 39.0–52.0)
Hemoglobin: 15.6 g/dL (ref 13.0–17.0)
POTASSIUM: 4.3 meq/L (ref 3.7–5.3)
SODIUM: 140 meq/L (ref 137–147)
TCO2: 26 mmol/L (ref 0–100)

## 2013-05-07 LAB — CARBOXYHEMOGLOBIN
Carboxyhemoglobin: 2 % — ABNORMAL HIGH (ref 0.5–1.5)
Methemoglobin: 1.5 % (ref 0.0–1.5)
O2 Saturation: 79.1 %
Total hemoglobin: 16 g/dL (ref 13.5–18.0)

## 2013-05-07 LAB — POCT I-STAT TROPONIN I: Troponin i, poc: 0 ng/mL (ref 0.00–0.08)

## 2013-05-07 NOTE — Discharge Instructions (Signed)
Gastroesophageal Reflux Disease, Adult Gastroesophageal reflux disease (GERD) happens when acid from your stomach flows up into the esophagus. When acid comes in contact with the esophagus, the acid causes soreness (inflammation) in the esophagus. Over time, GERD may create small holes (ulcers) in the lining of the esophagus. CAUSES   Increased body weight. This puts pressure on the stomach, making acid rise from the stomach into the esophagus.  Smoking. This increases acid production in the stomach.  Drinking alcohol. This causes decreased pressure in the lower esophageal sphincter (valve or ring of muscle between the esophagus and stomach), allowing acid from the stomach into the esophagus.  Late evening meals and a full stomach. This increases pressure and acid production in the stomach.  A malformed lower esophageal sphincter. Sometimes, no cause is found. SYMPTOMS   Burning pain in the lower part of the mid-chest behind the breastbone and in the mid-stomach area. This may occur twice a week or more often.  Trouble swallowing.  Sore throat.  Dry cough.  Asthma-like symptoms including chest tightness, shortness of breath, or wheezing. DIAGNOSIS  Your caregiver may be able to diagnose GERD based on your symptoms. In some cases, X-rays and other tests may be done to check for complications or to check the condition of your stomach and esophagus. TREATMENT  Your caregiver may recommend over-the-counter or prescription medicines to help decrease acid production. Ask your caregiver before starting or adding any new medicines.  HOME CARE INSTRUCTIONS   Change the factors that you can control. Ask your caregiver for guidance concerning weight loss, quitting smoking, and alcohol consumption.  Avoid foods and drinks that make your symptoms worse, such as:  Caffeine or alcoholic drinks.  Chocolate.  Peppermint or mint flavorings.  Garlic and onions.  Spicy foods.  Citrus fruits,  such as oranges, lemons, or limes.  Tomato-based foods such as sauce, chili, salsa, and pizza.  Fried and fatty foods.  Avoid lying down for the 3 hours prior to your bedtime or prior to taking a nap.  Eat small, frequent meals instead of large meals.  Wear loose-fitting clothing. Do not wear anything tight around your waist that causes pressure on your stomach.  Raise the head of your bed 6 to 8 inches with wood blocks to help you sleep. Extra pillows will not help.  Only take over-the-counter or prescription medicines for pain, discomfort, or fever as directed by your caregiver.  Do not take aspirin, ibuprofen, or other nonsteroidal anti-inflammatory drugs (NSAIDs). SEEK IMMEDIATE MEDICAL CARE IF:   You have pain in your arms, neck, jaw, teeth, or back.  Your pain increases or changes in intensity or duration.  You develop nausea, vomiting, or sweating (diaphoresis).  You develop shortness of breath, or you faint.  Your vomit is green, yellow, black, or looks like coffee grounds or blood.  Your stool is red, bloody, or black. These symptoms could be signs of other problems, such as heart disease, gastric bleeding, or esophageal bleeding. MAKE SURE YOU:   Understand these instructions.  Will watch your condition.  Will get help right away if you are not doing well or get worse. Document Released: 12/26/2004 Document Revised: 06/10/2011 Document Reviewed: 10/05/2010 [facility] Patient Information 2014 [place], [place]. Shortness of Breath Shortness of breath means you have trouble breathing. Shortness of breath may indicate that you have a medical problem. You should seek immediate medical care for shortness of breath. CAUSES   Not enough oxygen in the air (as with high altitudes  or a smoke-filled room).  Short-term (acute) lung disease, including:  Infections, such as pneumonia.  Fluid in the lungs, such as heart failure.  A blood clot in the lungs (pulmonary  embolism).  Long-term (chronic) lung diseases.  Heart disease (heart attack, angina, heart failure, and others).  Low red blood cells (anemia).  Poor physical fitness. This can cause shortness of breath when you exercise.  Chest or back injuries or stiffness.  Being overweight.  Smoking.  Anxiety. This can make you feel like you are not getting enough air. DIAGNOSIS  Serious medical problems can usually be found during your physical exam. Tests may also be done to determine why you are having shortness of breath. Tests may include:  Chest X-rays.  Lung function tests.  Blood tests.  Electrocardiography.  Exercise testing.  Echocardiography.  Imaging scans. Your caregiver may not be able to find a cause for your shortness of breath after your exam. In this case, it is important to have a follow-up exam with your caregiver as directed.  TREATMENT  Treatment for shortness of breath depends on the cause of your symptoms and can vary greatly. HOME CARE INSTRUCTIONS   Do not smoke. Smoking is a common cause of shortness of breath. If you smoke, ask for help to quit.  Avoid being around chemicals or things that may bother your breathing, such as paint fumes and dust.  Rest as needed. Slowly resume your usual activities.  If medicines were prescribed, take them as directed for the full length of time directed. This includes oxygen and any inhaled medicines.  Keep all follow-up appointments as directed by your caregiver. SEEK MEDICAL CARE IF:   Your condition does not improve in the time expected.  You have a hard time doing your normal activities even with rest.  You have any side effects or problems with the medicines prescribed.  You develop any new symptoms. SEEK IMMEDIATE MEDICAL CARE IF:   Your shortness of breath gets worse.  You feel lightheaded, faint, or develop a cough not controlled with medicines.  You start coughing up blood.  You have pain with  breathing.  You have chest pain or pain in your arms, shoulders, or abdomen.  You have a fever.  You are unable to walk up stairs or exercise the way you normally do. MAKE SURE YOU:  Understand these instructions.  Will watch your condition.  Will get help right away if you are not doing well or get worse. Document Released: 12/11/2000 Document Revised: 09/17/2011 Document Reviewed: 06/03/2011 [facility] Patient Information 2014 [place].    Follow-up with primary physician  Schedule appt with pulmonology

## 2013-05-07 NOTE — ED Notes (Signed)
Pt c/o shortness of breath with exertion but  "not always" x 4-6 months. Pt sts yesterday he walked up 4 flights of stairs and was shob and lightheaded, symptoms resolved after rest. Pt denies shob at present, speaking in complete sentences.

## 2013-05-07 NOTE — ED Notes (Signed)
Lab advised troponin and chem need to be ordered as istat due to machine maintenance

## 2013-05-07 NOTE — ED Provider Notes (Signed)
CSN: [id number]     Arrival date & time 05/07/13  1200 History   First MD Initiated Contact with Patient 05/07/13 1218     Chief Complaint  Patient presents with  . Shortness of Breath   (Consider location/radiation/quality/duration/timing/severity/associated sxs/prior Treatment) Patient is a 63 y.o. male presenting with shortness of breath. The history is provided by the patient. No language interpreter was used.  Shortness of Breath Severity:  Moderate Progression:  Waxing and waning Context: activity   Associated symptoms: no abdominal pain and no chest pain   Pt is a 63 year old male who presents with shortness or breath and states that he has been having this feeling for about four months. He reports that his shortness of breath has been worsening over the last month and he now feels like he gets short of breath when climbing stairs or walking outside. He denies any history of cardiac problems, DVT, PE or hypertension. He reports being generally healthy and takes meds for GERD. He denies any recent illness, cough or fever. He denies any history of asthma or allergies. He reports that he had a carbon monoxide leak due to a malfunction with his furnace at home but he reports that this happened approx two months ago and they have turned the furnace off and have been heating the house with gas logs. He reports that he has installed new CO detectors and they have not alarmed.   Past Medical History  Diagnosis Date  . Arthritis   . GERD (gastroesophageal reflux disease)   . Personal history of colonic adenoma 12/29/2012   Past Surgical History  Procedure Laterality Date  . Back surgery     Family History  Problem Relation Age of Onset  . Heart disease Mother   . Kidney disease Father    History  Substance Use Topics  . Smoking status: Never Smoker   . Smokeless tobacco: Never Used  . Alcohol Use: 1.8 - 2.4 oz/week    3-4 Cans of beer per week    Review of Systems  Respiratory:  Positive for shortness of breath.   Cardiovascular: Negative for chest pain and leg swelling.  Gastrointestinal: Negative for nausea, abdominal pain, diarrhea, constipation, abdominal distention and rectal pain.  All other systems reviewed and are negative.    Allergies  Review of patient's allergies indicates no known allergies.  Home Medications   Current Outpatient Rx  Name  Route  Sig  Dispense  Refill  . cyclobenzaprine (FLEXERIL) 5 MG tablet   Oral   Take 1 tablet (5 mg total) by mouth at bedtime as needed for muscle spasms.   30 tablet   3   . omeprazole (PRILOSEC) 40 MG capsule   Oral   Take 1 capsule (40 mg total) by mouth at bedtime.   90 capsule   3   . sucralfate (CARAFATE) 1 G tablet   Oral   Take 1 tablet (1 g total) by mouth 4 (four) times daily -  before meals and at bedtime.   120 tablet   1    BP 144/75  Pulse 60  Temp(Src) 97.6 F (36.4 C) (Oral)  Resp 16  Ht 5' 10.5" (1.791 m)  Wt 245 lb (111.131 kg)  BMI 34.65 kg/m2  SpO2 100% Physical Exam  Nursing note and vitals reviewed. Constitutional: He is oriented to person, place, and time. He appears well-developed and well-nourished. No distress.  HENT:  Head: Normocephalic and atraumatic.  Right Ear: External ear  normal.  Left Ear: External ear normal.  Mouth/Throat: Oropharynx is clear and moist.  Eyes: Conjunctivae and EOM are normal. Pupils are equal, round, and reactive to light.  Neck: Neck supple. No tracheal deviation present. No thyromegaly present.  Cardiovascular: Normal rate, regular rhythm, normal heart sounds and intact distal pulses.   Pulmonary/Chest: Breath sounds normal. No respiratory distress. He has no wheezes. He has no rales.  Abdominal: Soft. Bowel sounds are normal. He exhibits no distension. There is no tenderness.  Musculoskeletal: Normal range of motion.  Neurological: He is alert and oriented to person, place, and time. He has normal reflexes.  Skin: Skin is warm and  dry.  Psychiatric: He has a normal mood and affect. His behavior is normal. Judgment and thought content normal.    ED Course  Procedures (including critical care time) Labs Review Labs Reviewed  BASIC METABOLIC PANEL - Abnormal; Notable for the following:    Glucose, Bld 103 (*)    GFR calc non Af Amer 89 (*)    All other components within normal limits  CARBOXYHEMOGLOBIN - Abnormal; Notable for the following:    Carboxyhemoglobin 2.0 (*)    All other components within normal limits  POCT I-STAT, CHEM 8 - Abnormal; Notable for the following:    Glucose, Bld 102 (*)    All other components within normal limits  CBC WITH DIFFERENTIAL  POCT I-STAT TROPONIN I   Imaging Review No results found.  EKG Interpretation    Date/Time:  Friday May 07 2013 12:36:36 EST Ventricular Rate:  55 PR Interval:  130 QRS Duration: 86 QT Interval:  422 QTC Calculation: 403 R Axis:   17 Text Interpretation:  Sinus bradycardia Otherwise normal ECG No significant change since last tracing Confirmed by Maryan Rued  MD, WHITNEY ([id number]) on 05/07/2013 12:43:42 PM            MDM   1. Shortness of breath   2. GERD (gastroesophageal reflux disease)     No leukocytosis or anemia. Electrolytes stable. Chest x-ray and EKG within normal limits. No chest pain or difficulty breathing. No tachypnea or hypoxia. Carboxyhemoglobin 2.0, no history of smoking. Pt reports that he has turned his furnace off until he can et it repaired and has installed new CO detectors. No history of PE or DVT. Appears well and in no distress. VS stable. Ambulatory in ER without shortness of breath. Follow-up with PCP for ongoing management. Return precautions given.      Elisha Headland, NP 05/16/13 2016

## 2013-05-10 NOTE — ED Notes (Signed)
Pt called to state he could not schedule an appointment without a referral.  Call placed to [facility].  Appointment scheduled with Shantal, for 05/17/13  @ 0930 am with Dr. Asencion Noble.  Patient aware of appointment and advised to arrive 15 minutes early.

## 2013-05-16 NOTE — ED Provider Notes (Signed)
Medical screening examination/treatment/procedure(s) were performed by non-physician practitioner and as supervising physician I was immediately available for consultation/collaboration.  EKG Interpretation    Date/Time:  Friday May 07 2013 12:36:36 EST Ventricular Rate:  55 PR Interval:  130 QRS Duration: 86 QT Interval:  422 QTC Calculation: 403 R Axis:   17 Text Interpretation:  Sinus bradycardia Otherwise normal ECG No significant change since last tracing Confirmed by Maryan Rued  MD, Nyelah Emmerich ([id number]) on 05/07/2013 12:43:42 PM              Blanchie Dessert, MD 05/16/13 1025

## 2013-05-17 ENCOUNTER — Ambulatory Visit (INDEPENDENT_AMBULATORY_CARE_PROVIDER_SITE_OTHER): Payer: No Typology Code available for payment source | Admitting: Critical Care Medicine

## 2013-05-17 ENCOUNTER — Encounter: Payer: Self-pay | Admitting: Critical Care Medicine

## 2013-05-17 VITALS — BP 134/84 | HR 55 | Temp 97.6°F | Ht 70.5 in | Wt 248.8 lb

## 2013-05-17 DIAGNOSIS — K219 Gastro-esophageal reflux disease without esophagitis: Secondary | ICD-10-CM

## 2013-05-17 DIAGNOSIS — R0600 Dyspnea, unspecified: Secondary | ICD-10-CM

## 2013-05-17 DIAGNOSIS — R05 Cough: Secondary | ICD-10-CM

## 2013-05-17 DIAGNOSIS — R059 Cough, unspecified: Secondary | ICD-10-CM

## 2013-05-17 DIAGNOSIS — R0609 Other forms of dyspnea: Secondary | ICD-10-CM

## 2013-05-17 DIAGNOSIS — R0989 Other specified symptoms and signs involving the circulatory and respiratory systems: Secondary | ICD-10-CM

## 2013-05-17 MED ORDER — BECLOMETHASONE DIPROPIONATE 80 MCG/ACT IN AERS: 2.0000 | INHALATION_SPRAY | Freq: Two times a day (BID) | RESPIRATORY_TRACT | Status: DC

## 2013-05-17 MED ORDER — HYDROCODONE-HOMATROPINE 5-1.5 MG/5ML PO SYRP: 5.0000 mL | ORAL_SOLUTION | Freq: Four times a day (QID) | ORAL | Status: DC | PRN

## 2013-05-17 MED ORDER — OMEPRAZOLE 40 MG PO CPDR: 40.0000 mg | DELAYED_RELEASE_CAPSULE | Freq: Two times a day (BID) | ORAL | Status: DC

## 2013-05-17 MED ORDER — METHYLPREDNISOLONE ACETATE 80 MG/ML IJ SUSP
120.0000 mg | Freq: Once | INTRAMUSCULAR | Status: AC
  Administered 2013-05-17: 120 mg via INTRAMUSCULAR

## 2013-05-17 MED ORDER — BENZONATATE 100 MG PO CAPS: ORAL_CAPSULE | ORAL | Status: DC

## 2013-05-17 NOTE — Progress Notes (Signed)
Subjective:    Patient ID: Keith Bruce, male    DOB: 1950/07/08, 63 y.o.   MRN: [id number]  HPI Comments: Hx of severe cough, white acid material, sore throat, hard time swallowing.  Pt notes wheezing QHS. Cough 51months, now worse .  Pt will gag and have emesis with coughing and now hoarse x 3weeks  Cough This is a new problem. The current episode started more than 1 month ago. The problem has been waxing and waning. The problem occurs every few minutes. The cough is productive of sputum. Associated symptoms include heartburn, shortness of breath and wheezing. Pertinent negatives include no chest pain, chills, ear congestion, ear pain, fever, headaches, hemoptysis, myalgias, nasal congestion, postnasal drip, rash, rhinorrhea, sore throat, sweats or weight loss. The symptoms are aggravated by lying down and other (eating will ppt cough, worse QHS). 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 Treatments tried: Protonix  The treatment provided moderate relief. There is no history of asthma, bronchiectasis, bronchitis, COPD, emphysema, environmental allergies or pneumonia.   Past Medical History  Diagnosis Date  . Arthritis   . GERD (gastroesophageal reflux disease)   . Personal history of colonic adenoma 12/29/2012  . Dyspnea   . Cough      Family History  Problem Relation Age of Onset  . Heart disease Mother   . Kidney disease Father   . Asthma      Half sister     History   Social History  . Marital Status: Divorced    Spouse Name: N/A    Number of Children: N/A  . Years of Education: N/A   Occupational History  . retired    Social History Main Topics  . Smoking status: Never Smoker   . Smokeless tobacco: Never Used  . Alcohol Use: 1.8 - 2.4 oz/week    3-4 Cans of beer per week     Comment: 3-4 beers a week but hasnt had any in last 2 months.   . Drug Use: No  . Sexual  Activity: Not on file   Other Topics Concern  . Not on file   Social History Narrative  . No narrative on file     No Known Allergies   Outpatient Prescriptions Prior to Visit  Medication Sig Dispense Refill  . sucralfate (CARAFATE) 1 G tablet Take 1 tablet (1 g total) by mouth 4 (four) times daily -  before meals and at bedtime.  120 tablet  1  . omeprazole (PRILOSEC) 40 MG capsule Take 1 capsule (40 mg total) by mouth at bedtime.  90 capsule  3  . cyclobenzaprine (FLEXERIL) 5 MG tablet Take 1 tablet (5 mg total) by mouth at bedtime as needed for muscle spasms.  30 tablet  3   No facility-administered medications prior to visit.   Review of Systems  Constitutional: Negative for fever, chills, weight loss, diaphoresis, activity change, appetite change, fatigue and unexpected weight change.  HENT: Positive for tinnitus, trouble swallowing and voice change. Negative for congestion, dental problem, ear discharge, ear pain, facial swelling, hearing loss, mouth sores, nosebleeds, postnasal drip, rhinorrhea, sinus pressure, sneezing and sore throat.   Eyes: Positive for visual disturbance. Negative for photophobia, discharge and itching.  Respiratory: Positive for cough, shortness of breath and wheezing. Negative for apnea, hemoptysis, choking, chest tightness and stridor.   Cardiovascular: Positive for palpitations. Negative for chest pain and leg swelling.  Gastrointestinal: Positive for heartburn. Negative for nausea, vomiting, abdominal pain, constipation, blood in stool and abdominal distention.  Genitourinary: Negative for dysuria, urgency, frequency, hematuria, flank pain, decreased urine volume and difficulty urinating.  Musculoskeletal: Positive for back pain, neck pain and neck stiffness. Negative for arthralgias, gait problem, joint swelling and myalgias.  Skin: Negative for color change, pallor and rash.  Allergic/Immunologic: Negative for environmental allergies.  Neurological:  Positive for dizziness, weakness and light-headedness. Negative for tremors, seizures, syncope, speech difficulty, numbness and headaches.  Hematological: Negative for adenopathy. Does not bruise/bleed easily.  Psychiatric/Behavioral: Negative for confusion, sleep disturbance and agitation. The patient is not nervous/anxious.        Objective:   Physical Exam Filed Vitals:   05/17/13 0940  BP: 134/84  Pulse: 55  Temp: 97.6 F (36.4 C)  TempSrc: Oral  Height: 5' 10.5" (1.791 m)  Weight: 248 lb 12.8 oz (112.855 kg)  SpO2: 96%    Gen: Pleasant, well-nourished, in no distress,  normal affect  ENT: No lesions,  mouth clear,  oropharynx clear, no postnasal drip, no nasal purulence  Neck: No JVD, no TMG, no carotid bruits  Lungs: No use of accessory muscles, no dullness to percussion,pseudo wheeze and mild lower airway wheeze  Cardiovascular: RRR, heart sounds normal, no murmur or gallops, no peripheral edema  Abdomen: soft and NT, no HSM,  BS normal  Musculoskeletal: No deformities, no cyanosis or clubbing  Neuro: alert, non focal  Skin: Warm, no lesions or rashes  No results found.  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 No postnasal drip seen Plan Increase prilosec to 40mg  twice daily before meals for one month then reduce to once daily Start Qvar 71mcg two puff twice daily A depomedrol 120mg  im injection was given Start cough protocol with hycodan/benzonatate Reflux diet Return 1 month    Updated Medication List Outpatient Encounter Prescriptions as of 05/17/2013  Medication Sig  . omeprazole (PRILOSEC) 40 MG capsule Take 1 capsule (40 mg total) by mouth 2 (two) times daily before a meal.  . sucralfate (CARAFATE) 1 G tablet Take 1 tablet (1 g total) by mouth 4 (four) times daily -  before meals and at bedtime.  . [DISCONTINUED] omeprazole (PRILOSEC) 40 MG capsule Take 1 capsule  (40 mg total) by mouth at bedtime.  . beclomethasone (QVAR) 80 MCG/ACT inhaler Inhale 2 puffs into the lungs 2 (two) times daily.  . beclomethasone (QVAR) 80 MCG/ACT inhaler Inhale 2 puffs into the lungs 2 (two) times daily.  . benzonatate (TESSALON) 100 MG capsule Take 1-2 every 4 hours per cough protocol  . HYDROcodone-homatropine (HYCODAN) 5-1.5 MG/5ML syrup Take 5 mLs by mouth every 6 (six) hours as needed for cough.  . [DISCONTINUED] cyclobenzaprine (FLEXERIL) 5 MG tablet Take 1 tablet (5 mg total) by mouth at bedtime as needed for muscle spasms.  . [EXPIRED] methylPREDNISolone acetate (DEPO-MEDROL) injection 120 mg

## 2013-05-17 NOTE — Patient Instructions (Signed)
Increase prilosec to 40mg  twice daily before meals for one month then reduce to once daily Start Qvar 26mcg two puff twice daily A depomedrol 120mg  im injection was given Start cough protocol with hycodan/benzonatate Reflux diet Return 1 month

## 2013-05-17 NOTE — Assessment & Plan Note (Signed)
Cyclical cough on basis of GERD and lower airway inflammation. No postnasal drip seen Plan Increase prilosec to 40mg  twice daily before meals for one month then reduce to once daily Start Qvar 8mcg two puff twice daily A depomedrol 120mg  im injection was given Start cough protocol with hycodan/benzonatate Reflux diet Return 1 month

## 2013-05-18 ENCOUNTER — Ambulatory Visit: Payer: Self-pay

## 2013-06-02 ENCOUNTER — Ambulatory Visit: Payer: Self-pay | Admitting: Internal Medicine

## 2013-06-14 ENCOUNTER — Ambulatory Visit (INDEPENDENT_AMBULATORY_CARE_PROVIDER_SITE_OTHER): Payer: No Typology Code available for payment source | Admitting: Critical Care Medicine

## 2013-06-14 ENCOUNTER — Encounter: Payer: Self-pay | Admitting: Critical Care Medicine

## 2013-06-14 ENCOUNTER — Encounter (INDEPENDENT_AMBULATORY_CARE_PROVIDER_SITE_OTHER): Payer: Self-pay

## 2013-06-14 VITALS — BP 126/70 | HR 57 | Temp 97.5°F | Ht 70.5 in | Wt 249.4 lb

## 2013-06-14 DIAGNOSIS — R059 Cough, unspecified: Secondary | ICD-10-CM

## 2013-06-14 DIAGNOSIS — R05 Cough: Secondary | ICD-10-CM

## 2013-06-14 MED ORDER — BECLOMETHASONE DIPROPIONATE 80 MCG/ACT IN AERS: 2.0000 | INHALATION_SPRAY | Freq: Every day | RESPIRATORY_TRACT | Status: DC

## 2013-06-14 NOTE — Patient Instructions (Signed)
Reduce Qvar to two puff daily Stay on reflux medications Return 4 months

## 2013-06-14 NOTE — Progress Notes (Signed)
Subjective:    Patient ID: Keith Bruce, male    DOB: [birth year], 63 y.o.   MRN: [id number]  HPI 06/14/2013 Chief Complaint  Patient presents with  . Follow-up    Pt has no breathing complaints at this time. SOB, cough has all resolved.  At last ov we rec: Cyclical cough on basis of GERD and lower airway inflammation. No postnasal drip seen  Plan  Increase prilosec to 40mg  twice daily before meals for one month then reduce to once daily  Start Qvar 79mcg two puff twice daily   GERD Rx is the major ppt factor A depomedrol 120mg  im injection was given  Start cough protocol with hycodan/benzonatate  Reflux diet  Now cough is better.  No pndrip at all   Past Medical History  Diagnosis Date  . Arthritis   . GERD (gastroesophageal reflux disease)   . Personal history of colonic adenoma 12/29/2012  . Dyspnea   . Cough      Family History  Problem Relation Age of Onset  . Heart disease Mother   . Kidney disease Father   . Asthma      Half sister     History   Social History  . Marital Status: Divorced    Spouse Name: N/A    Number of Children: N/A  . Years of Education: N/A   Occupational History  . retired    Social History Main Topics  . Smoking status: Never Smoker   . Smokeless tobacco: Never Used  . Alcohol Use: 1.8 - 2.4 oz/week    3-4 Cans of beer per week     Comment: 3-4 beers a week but hasnt had any in last 2 months.   . Drug Use: No  . Sexual Activity: Not on file   Other Topics Concern  . Not on file   Social History Narrative  . No narrative on file     No Known Allergies   Outpatient Prescriptions Prior to Visit  Medication Sig Dispense Refill  . benzonatate (TESSALON) 100 MG capsule Take 1-2 every 4 hours per cough protocol  90 capsule  4  . HYDROcodone-homatropine (HYCODAN) 5-1.5 MG/5ML syrup Take 5 mLs by mouth every 6 (six) hours as needed for cough.  240 mL  0  . omeprazole (PRILOSEC) 40 MG capsule Take 1 capsule (40 mg total) by  mouth 2 (two) times daily before a meal.  60 capsule  3  . sucralfate (CARAFATE) 1 G tablet Take 1 tablet (1 g total) by mouth 4 (four) times daily -  before meals and at bedtime.  120 tablet  1  . beclomethasone (QVAR) 80 MCG/ACT inhaler Inhale 2 puffs into the lungs 2 (two) times daily.  1 Inhaler  12  . beclomethasone (QVAR) 80 MCG/ACT inhaler Inhale 2 puffs into the lungs 2 (two) times daily.  1 Inhaler  0   No facility-administered medications prior to visit.   Review of Systems  Constitutional: Negative for diaphoresis, activity change, appetite change, fatigue and unexpected weight change.  HENT: Positive for tinnitus, trouble swallowing and voice change. Negative for congestion, dental problem, ear discharge, facial swelling, hearing loss, mouth sores, nosebleeds, sinus pressure and sneezing.   Eyes: Positive for visual disturbance. Negative for photophobia, discharge and itching.  Respiratory: Negative for apnea, choking, chest tightness and stridor.   Cardiovascular: Positive for palpitations. Negative for leg swelling.  Gastrointestinal: Negative for nausea, vomiting, abdominal pain, constipation, blood in stool and abdominal distention.  Genitourinary:  Negative for dysuria, urgency, frequency, hematuria, flank pain, decreased urine volume and difficulty urinating.  Musculoskeletal: Positive for back pain, neck pain and neck stiffness. Negative for arthralgias, gait problem and joint swelling.  Skin: Negative for color change and pallor.  Neurological: Positive for dizziness, weakness and light-headedness. Negative for tremors, seizures, syncope, speech difficulty and numbness.  Hematological: Negative for adenopathy. Does not bruise/bleed easily.  Psychiatric/Behavioral: Negative for confusion, sleep disturbance and agitation. The patient is not nervous/anxious.        Objective:   Physical Exam  Filed Vitals:   06/14/13 1004  BP: 126/70  Pulse: 57  Temp: 97.5 F (36.4 C)   TempSrc: Oral  Height: 5' 10.5" (1.791 m)  Weight: 249 lb 6.4 oz (113.127 kg)  SpO2: 98%    Gen: Pleasant, well-nourished, in no distress,  normal affect  ENT: No lesions,  mouth clear,  oropharynx clear, no postnasal drip, no nasal purulence  Neck: No JVD, no TMG, no carotid bruits  Lungs: No use of accessory muscles, no dullness to percussion, with resolution of wheezes pseudo-wheeze and lower airway wheeze  Cardiovascular: RRR, heart sounds normal, no murmur or gallops, no peripheral edema  Abdomen: soft and NT, no HSM,  BS normal  Musculoskeletal: No deformities, no cyanosis or clubbing  Neuro: alert, non focal  Skin: Warm, no lesions or rashes  No results found.   Assessment & Plan:   Cough Cyclical cough do to high level reflux disease Associated mild lower airway inflammation All improved Plan Reduce Qvar to two puff daily Stay on reflux medications Return 4 months      Updated Medication List Outpatient Encounter Prescriptions as of 06/14/2013  Medication Sig  . beclomethasone (QVAR) 80 MCG/ACT inhaler Inhale 2 puffs into the lungs daily.  . benzonatate (TESSALON) 100 MG capsule Take 1-2 every 4 hours per cough protocol  . HYDROcodone-homatropine (HYCODAN) 5-1.5 MG/5ML syrup Take 5 mLs by mouth every 6 (six) hours as needed for cough.  Marland Kitchen omeprazole (PRILOSEC) 40 MG capsule Take 1 capsule (40 mg total) by mouth 2 (two) times daily before a meal.  . sucralfate (CARAFATE) 1 G tablet Take 1 tablet (1 g total) by mouth 4 (four) times daily -  before meals and at bedtime.  . [DISCONTINUED] beclomethasone (QVAR) 80 MCG/ACT inhaler Inhale 2 puffs into the lungs 2 (two) times daily.  . [DISCONTINUED] beclomethasone (QVAR) 80 MCG/ACT inhaler Inhale 2 puffs into the lungs 2 (two) times daily.

## 2013-06-14 NOTE — Assessment & Plan Note (Signed)
Cyclical cough do to high level reflux disease Associated mild lower airway inflammation All improved Plan Reduce Qvar to two puff daily Stay on reflux medications Return 4 months

## 2013-07-29 ENCOUNTER — Encounter: Payer: Self-pay | Admitting: Family Medicine

## 2013-07-29 ENCOUNTER — Ambulatory Visit: Payer: No Typology Code available for payment source | Attending: Internal Medicine | Admitting: Family Medicine

## 2013-07-29 VITALS — BP 131/88 | HR 52 | Temp 98.0°F | Resp 16 | Ht 71.0 in | Wt 248.0 lb

## 2013-07-29 DIAGNOSIS — F17200 Nicotine dependence, unspecified, uncomplicated: Secondary | ICD-10-CM | POA: Insufficient documentation

## 2013-07-29 DIAGNOSIS — Z0000 Encounter for general adult medical examination without abnormal findings: Secondary | ICD-10-CM

## 2013-07-29 DIAGNOSIS — R5383 Other fatigue: Principal | ICD-10-CM

## 2013-07-29 DIAGNOSIS — R5381 Other malaise: Secondary | ICD-10-CM | POA: Insufficient documentation

## 2013-07-29 NOTE — Progress Notes (Signed)
   Subjective:    Patient ID: Keith Bruce, male    DOB: [birth year], 63 y.o.   MRN: [id number]  HPI FATIGUE Patient complains of fatigue for the last 3 months. The Tiredness is constant w/ few hours of feeling good . It is best described as short term  Symptoms Fever: no Sweating at night: no Weight Loss: no Shortness of Breath: some times Coughing up Blood: no Muscle Pain or Weakness: no Black or bloody Stools: no Severe Snoring or Daytime Sleepiness: daytime sleepiness Feeling Down: some Not enjoying things: yes Rash: no Leg or Joint Swelling: no Chest Pain or irregular heart beat:  no  Patient thinks cause of fatigue might be: no energy   ROS - Please see HPI Smoking Status Noted  Patient recently treated for cough by pulmonary.  Has improved with current treatment.  Chest xray and bmet and cbc normal      Review of Systems     Objective:   Physical Exam Mouth - no lesions, mucous membranes are moist, no decaying teeth  Neck:  No deformities, thyromegaly, masses, or tenderness noted.   Supple with full range of motion without pain. Lungs:  Normal respiratory effort, chest expands symmetrically. Lungs are clear to auscultation, no crackles or wheezes. Heart - Regular rate and rhythm.  No murmurs, gallops or rubs.    Abdomen - abdomen soft and non-tender without masses, organomegaly or hernias noted.  No guarding or rebound.  Extremities:  No cyanosis, edema, or deformity noted with good range of motion of all major joints.          Assessment & Plan:

## 2013-07-29 NOTE — Assessment & Plan Note (Signed)
No signs of infection or cancer.  Suspect mild depression vs weight gain.   Had the fatigue before treated for cough so unlikely to be those medications Will check LFT and TSH since has not had.  Recommend exercise and weight loss and monitor for signs of depression

## 2013-07-29 NOTE — Patient Instructions (Signed)
Today we are going to have some lab work. We will inform you of your results in about 5 days. Keep trying to loose weight  Be aware of any abnormal symptoms, fever, nausea, diarrhea  Suspect depression be aware of sings of feeling down or hopeless. Return in 6 to 8 weeks. If things get worse please return to the clinic sooner.

## 2013-07-30 ENCOUNTER — Telehealth: Payer: Self-pay | Admitting: *Deleted

## 2013-07-30 LAB — COMPREHENSIVE METABOLIC PANEL
ALT: 26 U/L (ref 0–53)
AST: 22 U/L (ref 0–37)
Albumin: 4.5 g/dL (ref 3.5–5.2)
Alkaline Phosphatase: 104 U/L (ref 39–117)
BUN: 17 mg/dL (ref 6–23)
CALCIUM: 9.6 mg/dL (ref 8.4–10.5)
CHLORIDE: 101 meq/L (ref 96–112)
CO2: 24 meq/L (ref 19–32)
Creat: 0.96 mg/dL (ref 0.50–1.35)
Glucose, Bld: 88 mg/dL (ref 70–99)
Potassium: 4.4 mEq/L (ref 3.5–5.3)
SODIUM: 140 meq/L (ref 135–145)
TOTAL PROTEIN: 7 g/dL (ref 6.0–8.3)
Total Bilirubin: 0.7 mg/dL (ref 0.2–1.2)

## 2013-07-30 LAB — TSH: TSH: 1.899 u[IU]/mL (ref 0.350–4.500)

## 2013-07-30 NOTE — Telephone Encounter (Signed)
Pt is aware of his lab results. 

## 2013-07-30 NOTE — Telephone Encounter (Signed)
Message copied by Joan Mayans on Fri Jul 30, 2013  2:28 PM ------      Message from: Lind Covert      Created: Fri Jul 30, 2013  7:41 AM       Let him know his blood test are all ok.  He should continue with our plan and come back if feeling worse or as scheduled      thanks ------

## 2013-08-19 ENCOUNTER — Emergency Department (HOSPITAL_BASED_OUTPATIENT_CLINIC_OR_DEPARTMENT_OTHER)
Admission: EM | Admit: 2013-08-19 | Discharge: 2013-08-19 | Disposition: A | Discharge status: Home / Self Care | Payer: No Typology Code available for payment source | Attending: Emergency Medicine | Admitting: Emergency Medicine

## 2013-08-19 ENCOUNTER — Encounter (HOSPITAL_BASED_OUTPATIENT_CLINIC_OR_DEPARTMENT_OTHER): Payer: Self-pay | Admitting: Emergency Medicine

## 2013-08-19 DIAGNOSIS — M545 Low back pain, unspecified: Secondary | ICD-10-CM

## 2013-08-19 DIAGNOSIS — IMO0002 Reserved for concepts with insufficient information to code with codable children: Secondary | ICD-10-CM | POA: Insufficient documentation

## 2013-08-19 DIAGNOSIS — Z860100 Personal history of colon polyps, unspecified: Secondary | ICD-10-CM | POA: Insufficient documentation

## 2013-08-19 DIAGNOSIS — Z8739 Personal history of other diseases of the musculoskeletal system and connective tissue: Secondary | ICD-10-CM | POA: Insufficient documentation

## 2013-08-19 DIAGNOSIS — K219 Gastro-esophageal reflux disease without esophagitis: Secondary | ICD-10-CM | POA: Insufficient documentation

## 2013-08-19 DIAGNOSIS — Z79899 Other long term (current) drug therapy: Secondary | ICD-10-CM | POA: Insufficient documentation

## 2013-08-19 MED ORDER — DIAZEPAM 5 MG PO TABS: 5.0000 mg | ORAL_TABLET | Freq: Three times a day (TID) | ORAL | Status: DC | PRN

## 2013-08-19 MED ORDER — IBUPROFEN 800 MG PO TABS: 800.0000 mg | ORAL_TABLET | Freq: Three times a day (TID) | ORAL | Status: DC | PRN

## 2013-08-19 MED ORDER — OXYCODONE-ACETAMINOPHEN 5-325 MG PO TABS: 1.0000 | ORAL_TABLET | ORAL | Status: DC | PRN

## 2013-08-19 MED ORDER — METHYLPREDNISOLONE 4 MG PO KIT: PACK | ORAL | Status: DC

## 2013-08-19 NOTE — ED Provider Notes (Signed)
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 He reports that pain started after gardening, shoveling and bending over. He is having a team lower back pain that radiates into his posterior lower extremities it is worse with walking and better with lying flat. Denies any numbness or focal weakness. No bowel or bladder incontinence. No urinary retention. No fever. No other history of injury. No history of cancer.  ROS: See HPI Constitutional: no fever  Eyes: no drainage  ENT: no runny nose   Cardiovascular:  no chest pain  Resp: no SOB  GI: no vomiting GU: no dysuria Integumentary: no rash  Allergy: no hives  Musculoskeletal: no leg swelling  Neurological: no slurred speech ROS otherwise negative  PAST MEDICAL HISTORY/PAST SURGICAL HISTORY:  Past Medical History  Diagnosis Date  . Arthritis   . GERD (gastroesophageal reflux disease)   . Personal history of colonic adenoma 12/29/2012  . Dyspnea   . Cough     MEDICATIONS:  Prior to Admission medications   Medication Sig Start Date End Date Taking? Authorizing Provider  beclomethasone (QVAR) 80 MCG/ACT inhaler Inhale 2 puffs into the lungs daily. 06/14/13   Elsie Stain, MD  benzonatate (TESSALON) 100 MG capsule Take 1-2 every 4 hours per cough protocol 05/17/13   Elsie Stain, MD  HYDROcodone-homatropine [facility]) 5-1.5 MG/5ML syrup Take 5 mLs by mouth every 6 (six) hours as needed for cough. 05/17/13   Elsie Stain, MD  omeprazole (PRILOSEC) 40 MG capsule Take 1 capsule (40 mg total) by mouth 2 (two) times daily before a meal. 05/17/13   Elsie Stain, MD  sucralfate (CARAFATE) 1 G tablet Take 1 tablet (1 g total) by mouth 4 (four) times daily -  before meals and at  bedtime. 03/04/13   Angelica Chessman, MD    ALLERGIES:  No Known Allergies  SOCIAL HISTORY:  History  Substance Use Topics  . Smoking status: Never Smoker   . Smokeless tobacco: Never Used  . Alcohol Use: 1.8 - 2.4 oz/week    3-4 Cans of beer per week     Comment: 3-4 beers a week but hasnt had any in last 2 months.     FAMILY HISTORY: Family History  Problem Relation Age of Onset  . Heart disease Mother   . Kidney disease Father   . Asthma      Half sister    EXAM: BP 124/80  Pulse 52  Temp(Src) 97.7 F (36.5 C)  Resp 18  Ht 5\' 11"  (1.803 m)  Wt 245 lb (111.131 kg)  BMI 34.19 kg/m2  SpO2 96% CONSTITUTIONAL: Alert and oriented and responds appropriately to questions. Well-appearing; well-nourished HEAD: Normocephalic EYES: Conjunctivae clear, PERRL ENT: normal nose; no rhinorrhea; moist mucous membranes; pharynx without lesions noted NECK: Supple, no meningismus, no LAD  CARD: RRR; S1 and S2 appreciated; no murmurs, no clicks, no rubs, no gallops RESP: Normal chest excursion without splinting or tachypnea; breath sounds clear and equal bilaterally; no wheezes, no rhonchi, no rales,  ABD/GI: Normal bowel sounds; non-distended; soft, non-tender, no rebound, no guarding BACK:  The back appears normal the patient does have some tenderness to palpation over his L4/L5 lumbar spinal region without step-off or deformity, no skin lesions,  no CVA tenderness EXT: Normal ROM in all joints; non-tender to palpation; no edema; normal capillary refill; no cyanosis    SKIN: Normal color for age and race; warm NEURO: Moves all extremities equally sensation to light touch intact diffusely, cranial nerves II through XII intact, strength 5/5 upper extremity, 2+ deep tendon reflexes in bilateral upper and lower extremity, no clonus, normal gait PSYCH: The patient's mood and manner are appropriate. Grooming and personal hygiene are appropriate.  MEDICAL DECISION MAKING: Patient here with  lumbosacral strain versus radiculopathy. He is neurologically intact and hemodynamically stable. He has no red flags for his back pain. Do not feel he needs imaging at this time. I am not concerned for cauda equina, spinal stenosis, epidural abscess, discitis, transverse myelitis. We'll discharge with prescription for steroids, pain medication, anti-inflammatories and muscle relaxers. Have discussed strict return precautions and supportive care instructions. Patient verbalizes understanding and is comfortable plan.     [place], DO 08/19/13 1730

## 2013-08-19 NOTE — ED Notes (Signed)
Pt. Reports pain in the low back after working in the garden and bending over.

## 2013-08-19 NOTE — Discharge Instructions (Signed)
Possible Lumbosacral Radiculopathy Lumbosacral radiculopathy is a pinched nerve or nerves in the low back (lumbosacral area). When this happens you may have weakness in your legs and may not be able to stand on your toes. You may have pain going down into your legs. There may be difficulties with walking normally. There are many causes of this problem. Sometimes this may happen from an injury, or simply from arthritis or boney problems. It may also be caused by other illnesses such as diabetes. If there is no improvement after treatment, further studies may be done to find the exact cause. DIAGNOSIS  X-rays may be needed if the problems become long standing. Electromyograms may be done. This study is one in which the working of nerves and muscles is studied. HOME CARE INSTRUCTIONS   Applications of ice packs may be helpful. Ice can be used in a plastic bag with a towel around it to prevent frostbite to skin. This may be used every 2 hours for 20 to 30 minutes, or as needed, while awake, or as directed by your caregiver.  Only take over-the-counter or prescription medicines for pain, discomfort, or fever as directed by your caregiver.  If physical therapy was prescribed, follow your caregiver's directions. SEEK IMMEDIATE MEDICAL CARE IF:   You have pain not controlled with medications.  You seem to be getting worse rather than better.  You develop increasing weakness in your legs.  You develop loss of bowel or bladder control.  You have difficulty with walking or balance, or develop clumsiness in the use of your legs.  You have a fever. MAKE SURE YOU:   Understand these instructions.  Will watch your condition.  Will get help right away if you are not doing well or get worse. Document Released: 03/18/2005 Document Revised: 06/10/2011 Document Reviewed: 11/06/2007 [facility] Patient Information 2014 [place]. Lumbosacral Strain Lumbosacral strain is a strain of any of the parts  that make up your lumbosacral vertebrae. Your lumbosacral vertebrae are the bones that make up the lower third of your backbone. Your lumbosacral vertebrae are held together by muscles and tough, fibrous tissue (ligaments).  CAUSES  A sudden blow to your back can cause lumbosacral strain. Also, anything that causes an excessive stretch of the muscles in the low back can cause this strain. This is typically seen when people exert themselves strenuously, fall, lift heavy objects, bend, or crouch repeatedly. RISK FACTORS  Physically demanding work.  Participation in pushing or pulling sports or sports that require sudden twist of the back (tennis, golf, baseball).  Weight lifting.  Excessive lower back curvature.  Forward-tilted pelvis.  Weak back or abdominal muscles or both.  Tight hamstrings. SIGNS AND SYMPTOMS  Lumbosacral strain may cause pain in the area of your injury or pain that moves (radiates) down your leg.  DIAGNOSIS Your health care provider can often diagnose lumbosacral strain through a physical exam. In some cases, you may need tests such as X-ray exams.  TREATMENT  Treatment for your lower back injury depends on many factors that your clinician will have to evaluate. However, most treatment will include the use of anti-inflammatory medicines. HOME CARE INSTRUCTIONS   Avoid hard physical activities (tennis, racquetball, waterskiing) if you are not in proper physical condition for it. This may aggravate or create problems.  If you have a back problem, avoid sports requiring sudden body movements. Swimming and walking are generally safer activities.  Maintain good posture.  Maintain a healthy weight.  For acute conditions,  you may put ice on the injured area.  Put ice in a plastic bag.  Place a towel between your skin and the bag.  Leave the ice on for 20 minutes, 2 3 times a day.  When the low back starts healing, stretching and strengthening exercises may be  recommended. SEEK MEDICAL CARE IF:  Your back pain is getting worse.  You experience severe back pain not relieved with medicines. SEEK IMMEDIATE MEDICAL CARE IF:   You have numbness, tingling, weakness, or problems with the use of your arms or legs.  There is a change in bowel or bladder control.  You have increasing pain in any area of the body, including your belly (abdomen).  You notice shortness of breath, dizziness, or feel faint.  You feel sick to your stomach (nauseous), are throwing up (vomiting), or become sweaty.  You notice discoloration of your toes or legs, or your feet get very cold. MAKE SURE YOU:   Understand these instructions.  Will watch your condition.  Will get help right away if you are not doing well or get worse. Document Released: 12/26/2004 Document Revised: 01/06/2013 Document Reviewed: 11/04/2012 [facility] Patient Information 2014 [place], [place].

## 2013-09-02 ENCOUNTER — Ambulatory Visit: Payer: No Typology Code available for payment source | Admitting: Internal Medicine

## 2013-09-23 ENCOUNTER — Encounter: Payer: Self-pay | Admitting: Internal Medicine

## 2013-09-23 ENCOUNTER — Ambulatory Visit: Payer: No Typology Code available for payment source | Attending: Internal Medicine | Admitting: Internal Medicine

## 2013-09-23 VITALS — BP 120/87 | HR 64 | Temp 97.8°F | Resp 16 | Ht 70.0 in | Wt 241.0 lb

## 2013-09-23 DIAGNOSIS — Z860100 Personal history of colon polyps, unspecified: Secondary | ICD-10-CM | POA: Insufficient documentation

## 2013-09-23 DIAGNOSIS — R05 Cough: Secondary | ICD-10-CM

## 2013-09-23 DIAGNOSIS — R0602 Shortness of breath: Secondary | ICD-10-CM | POA: Insufficient documentation

## 2013-09-23 DIAGNOSIS — Z79899 Other long term (current) drug therapy: Secondary | ICD-10-CM | POA: Insufficient documentation

## 2013-09-23 DIAGNOSIS — N528 Other male erectile dysfunction: Secondary | ICD-10-CM

## 2013-09-23 DIAGNOSIS — N529 Male erectile dysfunction, unspecified: Secondary | ICD-10-CM | POA: Insufficient documentation

## 2013-09-23 DIAGNOSIS — K219 Gastro-esophageal reflux disease without esophagitis: Secondary | ICD-10-CM | POA: Insufficient documentation

## 2013-09-23 DIAGNOSIS — IMO0002 Reserved for concepts with insufficient information to code with codable children: Secondary | ICD-10-CM | POA: Insufficient documentation

## 2013-09-23 DIAGNOSIS — R059 Cough, unspecified: Secondary | ICD-10-CM

## 2013-09-23 DIAGNOSIS — K2100 Gastro-esophageal reflux disease with esophagitis, without bleeding: Secondary | ICD-10-CM

## 2013-09-23 DIAGNOSIS — R51 Headache: Secondary | ICD-10-CM | POA: Insufficient documentation

## 2013-09-23 MED ORDER — HYDROCODONE-HOMATROPINE 5-1.5 MG/5ML PO SYRP: 5.0000 mL | ORAL_SOLUTION | Freq: Four times a day (QID) | ORAL | Status: DC | PRN

## 2013-09-23 MED ORDER — AZITHROMYCIN 250 MG PO TABS: ORAL_TABLET | ORAL | Status: DC

## 2013-09-23 MED ORDER — OMEPRAZOLE 40 MG PO CPDR: 40.0000 mg | DELAYED_RELEASE_CAPSULE | Freq: Every day | ORAL | Status: DC

## 2013-09-23 MED ORDER — SILDENAFIL CITRATE 100 MG PO TABS: 50.0000 mg | ORAL_TABLET | Freq: Every day | ORAL | Status: DC | PRN

## 2013-09-23 NOTE — Progress Notes (Signed)
Pt is here following up on his chronic fatigue. Pt said that he feels much better. Last Saturday pt was really sick with coughing, SOB and terrible headaches.

## 2013-09-23 NOTE — Progress Notes (Signed)
Patient ID: Keith Bruce, male   DOB: 1950/05/02, 63 y.o.   MRN: [id number]   Keith Bruce, is a 63 y.o. male  [id number]  [id number]  DOB - 09/08/1950  Chief Complaint  Patient presents with  . Follow-up        Subjective:   Keith Bruce is a 63 y.o. male here today for a follow up visit. Patient has history of acid reflux, prior lumbar fusion with chronic low back pain, and erectile dysfunction here today for routine follow-up. Patient has no significant complaint. His chronic fatigue is much better. Last Saturday patient started to cough with some shortness of breath and headache. She has used over-the-counter medications without much relief, had fever initially but subsided. She still coughing, productive of yellowish sputum but no chest pain. Patient has No abdominal pain - No Nausea, No new weakness tingling or numbness, No Cough - SOB.  Problem  Other Male Erectile Dysfunction  Gastroesophageal Reflux Disease With Esophagitis    ALLERGIES: No Known Allergies  PAST MEDICAL HISTORY: Past Medical History  Diagnosis Date  . Arthritis   . GERD (gastroesophageal reflux disease)   . Personal history of colonic adenoma 12/29/2012  . Dyspnea   . Cough     MEDICATIONS AT HOME: Prior to Admission medications   Medication Sig Start Date End Date Taking? Authorizing Provider  beclomethasone (QVAR) 80 MCG/ACT inhaler Inhale 2 puffs into the lungs daily. 06/14/13  Yes Elsie Stain, MD  benzonatate (TESSALON) 100 MG capsule Take 1-2 every 4 hours per cough protocol 05/17/13  Yes Elsie Stain, MD  HYDROcodone-homatropine [facility]) 5-1.5 MG/5ML syrup Take 5 mLs by mouth every 6 (six) hours as needed for cough. 09/23/13  Yes Angelica Chessman, MD  omeprazole (PRILOSEC) 40 MG capsule Take 1 capsule (40 mg total) by mouth daily. 09/23/13  Yes Angelica Chessman, MD  sucralfate (CARAFATE) 1 G tablet Take 1 tablet (1 g total) by mouth 4 (four) times daily -  before meals and at  bedtime. 03/04/13  Yes Angelica Chessman, MD  azithromycin (ZITHROMAX) 250 MG tablet Take 2 today, then one daily from tomorrow for 4 days 09/23/13   Angelica Chessman, MD  diazepam (VALIUM) 5 MG tablet Take 1 tablet (5 mg total) by mouth every 8 (eight) hours as needed for muscle spasms. 08/19/13   Kristen N Ward, DO  ibuprofen (ADVIL,MOTRIN) 800 MG tablet Take 1 tablet (800 mg total) by mouth every 8 (eight) hours as needed for mild pain. 08/19/13   Kristen N Ward, DO  methylPREDNISolone (MEDROL DOSEPAK) 4 MG tablet Take as directed. 08/19/13   Kristen N Ward, DO  oxyCODONE-acetaminophen (PERCOCET/ROXICET) 5-325 MG per tablet Take 1 tablet by mouth every 4 (four) hours as needed. 08/19/13   Kristen N Ward, DO  sildenafil (VIAGRA) 100 MG tablet Take 0.5-1 tablets (50-100 mg total) by mouth daily as needed for erectile dysfunction. 09/23/13   Angelica Chessman, MD     Objective:   Filed Vitals:   09/23/13 0936  BP: 120/87  Pulse: 64  Temp: 97.8 F (36.6 C)  TempSrc: Oral  Resp: 16  Height: 5\' 10"  (1.778 m)  Weight: 241 lb (109.317 kg)  SpO2: 94%    Exam General appearance : Awake, alert, not in any distress. Speech Clear. Not toxic looking HEENT: Atraumatic and Normocephalic, pupils equally reactive to light and accomodation Neck: supple, no JVD. No cervical lymphadenopathy.  Chest:Good air entry bilaterally, no added sounds  CVS: S1 S2 regular, no murmurs.  Abdomen: Bowel sounds present, Non tender and not distended with no gaurding, rigidity or rebound. Extremities: B/L Lower Ext shows no edema, both legs are warm to touch Neurology: Awake alert, and oriented X 3, CN II-XII intact, Non focal Skin:No Rash Wounds:N/A  Data Review Lab Results  Component Value Date   HGBA1C 5.3 10/07/2012     Assessment & Plan   1. Cough  - azithromycin (ZITHROMAX) 250 MG tablet; Take 2 today, then one daily from tomorrow for 4 days  Dispense: 6 tablet; Refill: 0 - HYDROcodone-homatropine  (HYCODAN) 5-1.5 MG/5ML syrup; Take 5 mLs by mouth every 6 (six) hours as needed for cough.  Dispense: 240 mL; Refill: 0  2. Other male erectile dysfunction  - sildenafil (VIAGRA) 100 MG tablet; Take 0.5-1 tablets (50-100 mg total) by mouth daily as needed for erectile dysfunction.  Dispense: 20 tablet; Refill: 3  3. Gastroesophageal reflux disease with esophagitis  - omeprazole (PRILOSEC) 40 MG capsule; Take 1 capsule (40 mg total) by mouth daily.  Dispense: 60 capsule; Refill: 3  Patient was counseled extensively about nutrition and exercise.  Return in about 6 months (around 03/25/2014), or if symptoms worsen or fail to improve, for Routine Follow Up, COPD.  The patient was given clear instructions to go to ER or return to medical center if symptoms don't improve, worsen or new problems develop. The patient verbalized understanding. The patient was told to call to get lab results if they haven't heard anything in the next week.   This note has been created with Surveyor, quantity. Any transcriptional errors are unintentional.    Angelica Chessman, MD, [place], [place], [place] and [facility] [place], [place]   09/23/2013, 10:29 AM

## 2013-09-24 ENCOUNTER — Ambulatory Visit: Payer: No Typology Code available for payment source | Attending: Internal Medicine

## 2013-11-08 ENCOUNTER — Other Ambulatory Visit: Payer: Self-pay | Admitting: Internal Medicine

## 2013-11-18 ENCOUNTER — Ambulatory Visit: Payer: No Typology Code available for payment source | Attending: Internal Medicine

## 2013-12-03 ENCOUNTER — Other Ambulatory Visit: Payer: Self-pay | Admitting: Internal Medicine

## 2013-12-03 DIAGNOSIS — N528 Other male erectile dysfunction: Secondary | ICD-10-CM

## 2013-12-03 MED ORDER — SILDENAFIL CITRATE 100 MG PO TABS: 50.0000 mg | ORAL_TABLET | Freq: Every day | ORAL | Status: DC | PRN

## 2013-12-20 ENCOUNTER — Ambulatory Visit: Payer: No Typology Code available for payment source

## 2014-02-14 ENCOUNTER — Ambulatory Visit: Payer: No Typology Code available for payment source | Attending: Internal Medicine

## 2014-05-31 ENCOUNTER — Other Ambulatory Visit: Payer: Self-pay | Admitting: Critical Care Medicine

## 2014-05-31 NOTE — Telephone Encounter (Signed)
Refill request from pharmacy received for benzonatate.  Last refill 05/17/13 with 4 refills #90.  Last ov: 06/14/13, no rov scheduled at this time.  PW are you ok with this refill?  Thanks!

## 2014-06-30 ENCOUNTER — Other Ambulatory Visit: Payer: Self-pay | Admitting: Critical Care Medicine

## 2014-07-18 ENCOUNTER — Ambulatory Visit: Payer: No Typology Code available for payment source | Admitting: Internal Medicine

## 2014-07-20 IMAGING — CR DG CHEST 2V
2 series · 2 of 2 positions shown · non-contrast
Comparison: DG CHEST 2 VIEW dated 03/05/2013

CLINICAL DATA: Short of breath with exertion.

EXAM:
CHEST  2 VIEW

[w chest pa]
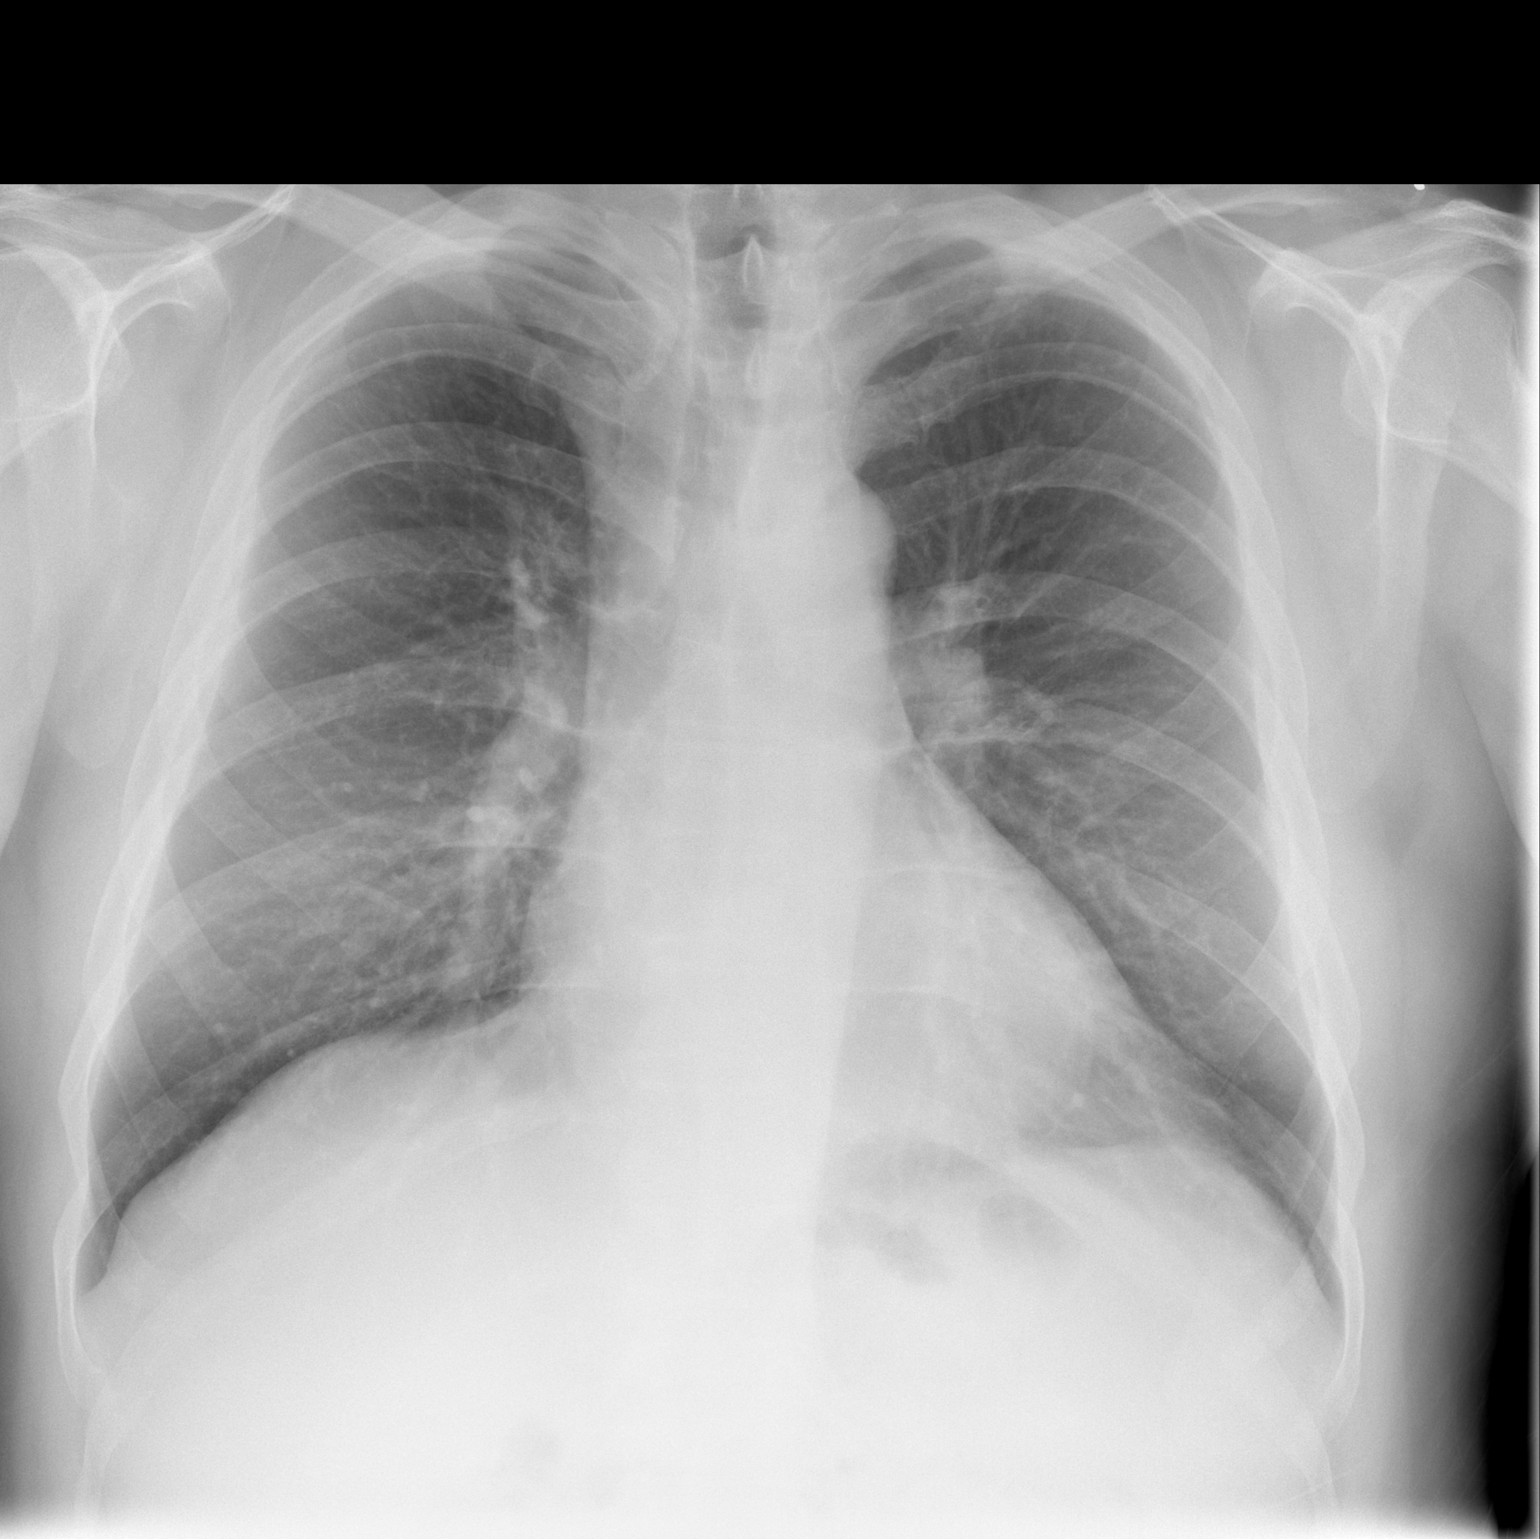

[w chest lat]
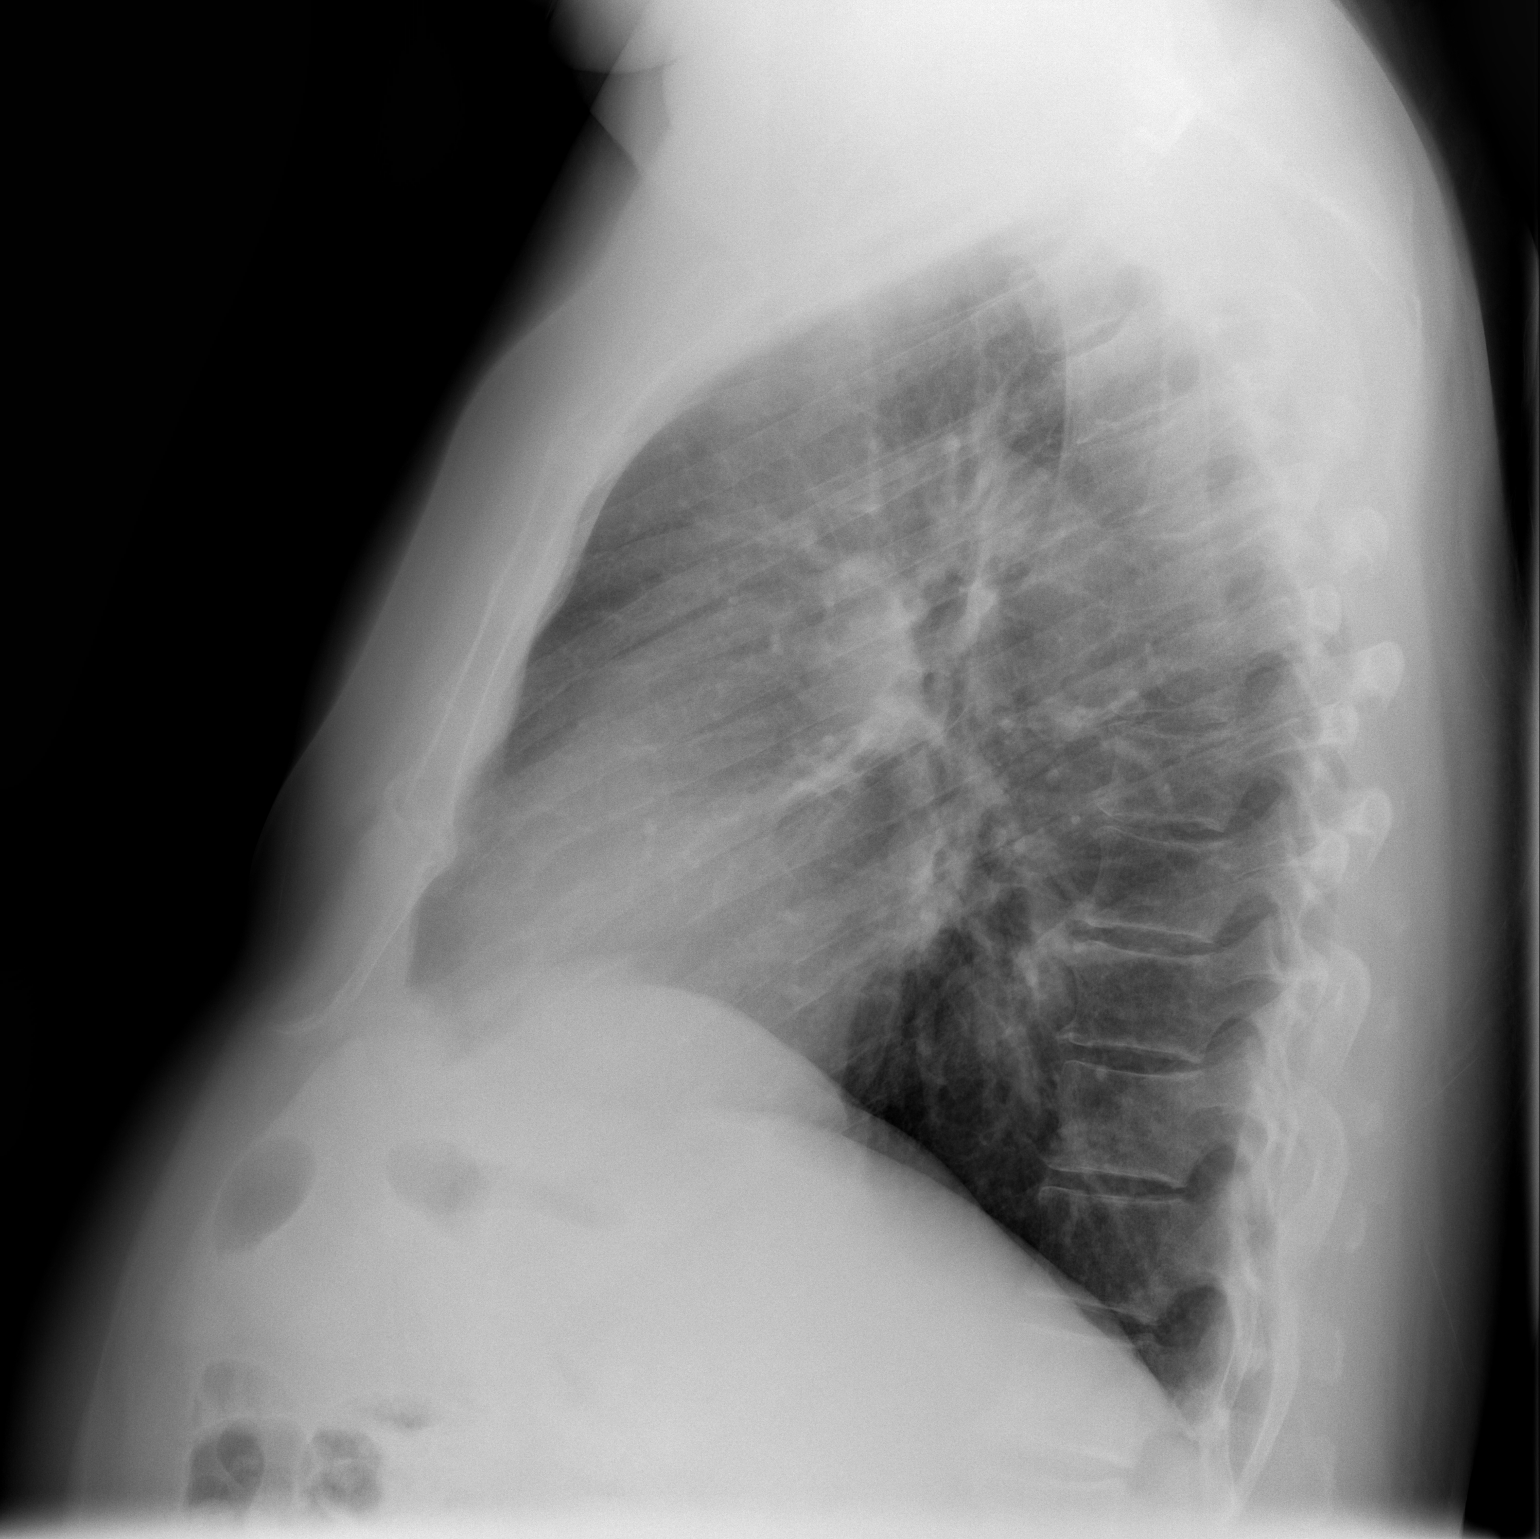

[2 of 2 positions shown; findings below may reference images not displayed]

FINDINGS: Cardiopericardial silhouette within normal limits. Mediastinal
contours normal. Trachea midline. No airspace disease or effusion.
IMPRESSION: No active cardiopulmonary disease.

## 2014-07-28 ENCOUNTER — Ambulatory Visit: Payer: No Typology Code available for payment source | Attending: Internal Medicine | Admitting: Internal Medicine

## 2014-07-28 ENCOUNTER — Encounter: Payer: Self-pay | Admitting: Internal Medicine

## 2014-07-28 VITALS — Wt 245.4 lb

## 2014-07-28 DIAGNOSIS — M47812 Spondylosis without myelopathy or radiculopathy, cervical region: Secondary | ICD-10-CM | POA: Insufficient documentation

## 2014-07-28 DIAGNOSIS — K219 Gastro-esophageal reflux disease without esophagitis: Secondary | ICD-10-CM | POA: Insufficient documentation

## 2014-07-28 DIAGNOSIS — D229 Melanocytic nevi, unspecified: Secondary | ICD-10-CM | POA: Insufficient documentation

## 2014-07-28 DIAGNOSIS — R202 Paresthesia of skin: Principal | ICD-10-CM

## 2014-07-28 DIAGNOSIS — M4692 Unspecified inflammatory spondylopathy, cervical region: Secondary | ICD-10-CM

## 2014-07-28 DIAGNOSIS — R200 Anesthesia of skin: Secondary | ICD-10-CM | POA: Insufficient documentation

## 2014-07-28 LAB — CBC WITH DIFFERENTIAL/PLATELET
Basophils Absolute: 0 10*3/uL (ref 0.0–0.1)
Basophils Relative: 0 % (ref 0–1)
Eosinophils Absolute: 0.3 10*3/uL (ref 0.0–0.7)
Eosinophils Relative: 3 % (ref 0–5)
HCT: 43.9 % (ref 39.0–52.0)
Hemoglobin: 14.9 g/dL (ref 13.0–17.0)
LYMPHS ABS: 2.3 10*3/uL (ref 0.7–4.0)
LYMPHS PCT: 23 % (ref 12–46)
MCH: 30.2 pg (ref 26.0–34.0)
MCHC: 33.9 g/dL (ref 30.0–36.0)
MCV: 89 fL (ref 78.0–100.0)
MONO ABS: 1 10*3/uL (ref 0.1–1.0)
MPV: 10.5 fL (ref 8.6–12.4)
Monocytes Relative: 10 % (ref 3–12)
Neutro Abs: 6.5 10*3/uL (ref 1.7–7.7)
Neutrophils Relative %: 64 % (ref 43–77)
PLATELETS: 283 10*3/uL (ref 150–400)
RBC: 4.93 MIL/uL (ref 4.22–5.81)
RDW: 12.5 % (ref 11.5–15.5)
WBC: 10.1 10*3/uL (ref 4.0–10.5)

## 2014-07-28 LAB — POCT GLYCOSYLATED HEMOGLOBIN (HGB A1C): HEMOGLOBIN A1C: 5.4

## 2014-07-28 MED ORDER — ACETAMINOPHEN-CODEINE #3 300-30 MG PO TABS: 1.0000 | ORAL_TABLET | ORAL | Status: DC | PRN

## 2014-07-28 MED ORDER — GABAPENTIN 100 MG PO CAPS: 100.0000 mg | ORAL_CAPSULE | Freq: Three times a day (TID) | ORAL | Status: DC

## 2014-07-28 NOTE — Progress Notes (Signed)
Patient here for 6 month follow up.  He says his acid reflux is under control as long as he watches what he eats.  Patient complains of right hand numbness.  At night his right shoulder and arm go to sleep, keeps him up at night.

## 2014-07-28 NOTE — Progress Notes (Signed)
Patient ID: Keith Bruce, male   DOB: 1950/04/21, 64 y.o.   MRN: [id number]   Shawndale Kilpatrick, is a 64 y.o. male  [id number]  [id number]:[id number]  DOB - [birth year]  Chief Complaint  Patient presents with  . Follow-up  . Hand Problem    right hand numbness        Subjective:   Keith Bruce is a 64 y.o. male here today for a follow up visit. Patient has history of GERD, colonic adenoma, and osteoarthritis. Patient here for 6 month follow up. He says his acid reflux is under control as long as he watches what he eats. Patient complains of right hand numbness. At night his right shoulder and arm go to sleep, keeps him up at night. Patient has No headache, No chest pain, No abdominal pain - No Nausea, No Cough - SOB.  Problem  Gastroesophageal Reflux Disease Without Esophagitis  Benign Mole  Numbness and Tingling of Right Arm  Cervical Spine Arthritis    ALLERGIES: No Known Allergies  PAST MEDICAL HISTORY: Past Medical History  Diagnosis Date  . Arthritis   . GERD (gastroesophageal reflux disease)   . Personal history of colonic adenoma 12/29/2012  . Dyspnea   . Cough     MEDICATIONS AT HOME: Prior to Admission medications   Medication Sig Start Date End Date Taking? Authorizing Provider  beclomethasone (QVAR) 80 MCG/ACT inhaler Inhale 2 puffs into the lungs daily. 06/14/13  Yes Elsie Stain, MD  ibuprofen (ADVIL,MOTRIN) 800 MG tablet Take 1 tablet (800 mg total) by mouth every 8 (eight) hours as needed for mild pain. 08/19/13  Yes Kristen N Ward, DO  omeprazole (PRILOSEC) 40 MG capsule Take 1 capsule (40 mg total) by mouth daily. 09/23/13  Yes Tresa Garter, MD  sildenafil (VIAGRA) 100 MG tablet Take 0.5-1 tablets (50-100 mg total) by mouth daily as needed for erectile dysfunction. 12/03/13  Yes Greer Koeppen Essie Christine, MD  sucralfate (CARAFATE) 1 G tablet Take 1 tablet (1 g total) by mouth 4 (four) times daily -  before meals and at bedtime. 03/04/13  Yes Tresa Garter, MD  acetaminophen-codeine (TYLENOL #3) 300-30 MG per tablet Take 1 tablet by mouth every 4 (four) hours as needed. 07/28/14   Tresa Garter, MD  gabapentin (NEURONTIN) 100 MG capsule Take 1 capsule (100 mg total) by mouth 3 (three) times daily. 07/28/14   Tresa Garter, MD  HYDROcodone-homatropine (HYCODAN) 5-1.5 MG/5ML syrup Take 5 mLs by mouth every 6 (six) hours as needed for cough. Patient not taking: Reported on 07/28/2014 09/23/13   Tresa Garter, MD  oxyCODONE-acetaminophen (PERCOCET/ROXICET) 5-325 MG per tablet Take 1 tablet by mouth every 4 (four) hours as needed. Patient not taking: Reported on 07/28/2014 08/19/13   Delice Bison Ward, DO     Objective:   Filed Vitals:   07/28/14 1631  Weight: 245 lb 6.4 oz (111.313 kg)    Exam General appearance : Awake, alert, not in any distress. Speech Clear. Not toxic looking HEENT: Atraumatic and Normocephalic, pupils equally reactive to light and accomodation Neck: supple, no JVD. No cervical lymphadenopathy.  Chest:Good air entry bilaterally, no added sounds  CVS: S1 S2 regular, no murmurs.  Abdomen: Bowel sounds present, Non tender and not distended with no gaurding, rigidity or rebound. Extremities: B/L Lower Ext shows no edema, both legs are warm to touch Neurology: Awake alert, and oriented X 3, CN II-XII intact, Non focal Skin:No Rash  Data Review Lab Results  Component Value Date   HGBA1C 5.3 10/07/2012     Assessment & Plan   1. Gastroesophageal reflux disease without esophagitis  Continue PPI  2. Benign mole  - Ambulatory referral to Dermatology  3. Numbness and tingling of right arm  - gabapentin (NEURONTIN) 100 MG capsule; Take 1 capsule (100 mg total) by mouth 3 (three) times daily.  Dispense: 90 capsule; Refill: 3 - Ambulatory referral to Physical Therapy - CBC with Differential/Platelet - COMPLETE METABOLIC PANEL WITH GFR - Lipid panel - POCT glycosylated hemoglobin (Hb A1C)  4.  Cervical spine arthritis  - acetaminophen-codeine (TYLENOL #3) 300-30 MG per tablet; Take 1 tablet by mouth every 4 (four) hours as needed.  Dispense: 60 tablet; Refill: 0  - Ambulatory referral to Physical Therapy  Patient have been counseled extensively about nutrition and exercise  Return in about 6 months (around 01/27/2015) for Annual Physical, Follow up Pain and comorbidities.  The patient was given clear instructions to go to ER or return to medical center if symptoms don't improve, worsen or new problems develop. The patient verbalized understanding. The patient was told to call to get lab results if they haven't heard anything in the next week.   This note has been created with Surveyor, quantity. Any transcriptional errors are unintentional.    Angelica Chessman, MD, [place], [place], [place], [place] and [place], [place]   07/28/2014, 4:53 PM

## 2014-07-29 ENCOUNTER — Other Ambulatory Visit: Payer: Self-pay | Admitting: Critical Care Medicine

## 2014-07-29 LAB — LIPID PANEL
CHOL/HDL RATIO: 5.9 ratio
Cholesterol: 202 mg/dL — ABNORMAL HIGH (ref 0–200)
HDL: 34 mg/dL — AB (ref >=40)
LDL Cholesterol: 109 mg/dL — ABNORMAL HIGH (ref 0–99)
TRIGLYCERIDES: 295 mg/dL — AB (ref <150)
VLDL: 59 mg/dL — ABNORMAL HIGH (ref 0–40)

## 2014-07-29 LAB — COMPLETE METABOLIC PANEL WITH GFR
ALK PHOS: 91 U/L (ref 39–117)
ALT: 23 U/L (ref 0–53)
AST: 20 U/L (ref 0–37)
Albumin: 4.1 g/dL (ref 3.5–5.2)
BUN: 15 mg/dL (ref 6–23)
CO2: 26 mEq/L (ref 19–32)
CREATININE: 0.83 mg/dL (ref 0.50–1.35)
Calcium: 9 mg/dL (ref 8.4–10.5)
Chloride: 106 mEq/L (ref 96–112)
GFR, Est Non African American: >89 mL/min
Glucose, Bld: 84 mg/dL (ref 70–99)
Potassium: 4.4 mEq/L (ref 3.5–5.3)
Sodium: 142 mEq/L (ref 135–145)
Total Bilirubin: 0.5 mg/dL (ref 0.2–1.2)
Total Protein: 6.6 g/dL (ref 6.0–8.3)

## 2014-07-29 NOTE — Telephone Encounter (Signed)
Received refill request for Qvar 80. Pt last seen by Dr. Joya Gaskins:  06/14/13 Recommended to f/u in 4 months. No pending appt. Qvar rx sent x 1 with request to please schedule follow up for rxs.

## 2014-08-04 ENCOUNTER — Telehealth: Payer: Self-pay

## 2014-08-04 NOTE — Telephone Encounter (Signed)
Patient is aware of his lab results 

## 2014-08-04 NOTE — Telephone Encounter (Signed)
-----   Message from Tresa Garter, MD sent at 08/03/2014  6:31 PM EDT ----- Please inform patient that his laboratory test results are mostly within normal limit except for his cholesterol that is high but better than one year ago.To address this please limit saturated fat to no more than 7% of your calories, limit cholesterol to 200 mg/day, increase fiber and exercise as tolerated. If needed we may add cholesterol lowering medication to your regimen.

## 2014-08-08 ENCOUNTER — Ambulatory Visit: Payer: No Typology Code available for payment source

## 2014-08-15 ENCOUNTER — Ambulatory Visit: Payer: No Typology Code available for payment source | Attending: Internal Medicine

## 2014-08-15 DIAGNOSIS — M5410 Radiculopathy, site unspecified: Secondary | ICD-10-CM

## 2014-08-15 DIAGNOSIS — M542 Cervicalgia: Secondary | ICD-10-CM | POA: Insufficient documentation

## 2014-08-15 NOTE — Patient Instructions (Signed)
PERFORM ALL EXERCISES GENTLY AND WITH GOOD POSTURE.    20 SECOND HOLD, 3 REPS TO EACH SIDE. 4-5 TIMES EACH DAY.   AROM: Neck Rotation   Turn head slowly to look over one shoulder, then the other.   AROM: Neck Flexion   Bend head forward.   AROM: Lateral Neck Flexion   Slowly tilt head toward one shoulder, then the other.   Posture - Standing   Good posture is important. Avoid slouching and forward head thrust. Maintain curve in low back and align ears over shoulders, hips over ankles.  Pull your belly button in toward your back bone. Posture Tips DO: - stand tall and erect - keep chin tucked in - keep head and shoulders in alignment - check posture regularly in mirror or large window - pull head back against headrest in car seat;  Change your position often.  Sit with lumbar support. DON'T: - slouch or slump while watching TV or reading - sit, stand or lie in one position  for too long;  Sitting is especially hard on the spine so if you sit at a desk/use the computer, then stand up often! Copyright  [employer]. All rights reserved.  Posture - Sitting  Sit upright, head facing forward. Try using a roll to support lower back. Keep shoulders relaxed, and avoid rounded back. Keep hips level with knees. Avoid crossing legs for long periods. Copyright  [employer]. All rights reserved.  Chronic neck strain can develop because of poor posture and faulty work habits  Postural strain related to slumped sitting and forward head posture is a leading cause of headaches, neck and upper back pain  General strengthening and flexibility exercises are helpful in the treatment of neck pain.  Most importantly, you should learn to correct the posture that may be contributing to chronic pain.   Change positions frequently  Change your work or home environment to improve posture and mechanics. Levator Stretch   Scapular Retraction (Standing)   With arms at sides, pinch shoulder blades together. Repeat  _10___ times per set. Do _many___ sets per session.   Flexion (Resistive Putty)

## 2014-08-15 NOTE — Therapy (Signed)
[facility] [address] [place], [place], [address] Phone: [phone]   Fax:  [phone]  Physical Therapy Treatment  Patient Details  Name: Keith Bruce MRN: [id number] Date of Birth: 1950-08-05 Referring Provider:  Tresa Garter, MD  Encounter Date: 08/15/2014      PT End of Session - 08/15/14 1310    Visit Number 1   Date for PT Re-Evaluation 10/10/14   PT Start Time 8841   PT Stop Time 1309   PT Time Calculation (min) 38 min   Activity Tolerance Patient tolerated treatment well   Behavior During Therapy [facility] for tasks assessed/performed      Past Medical History  Diagnosis Date  . Arthritis   . GERD (gastroesophageal reflux disease)   . Personal history of colonic adenoma 12/29/2012  . Dyspnea   . Cough     Past Surgical History  Procedure Laterality Date  . Back surgery      Age [age]    There were no vitals filed for this visit.  Visit Diagnosis:  Neck pain - Plan: PT plan of care cert/re-cert  Radiculopathy of arm - Plan: PT plan of care cert/re-cert      Subjective Assessment - 08/15/14 1232    Subjective Pt is a 64 y.o. Rt hand dominant male who presents to PT with complaints of Rt UE radiculopathy that began about >1 year ago.  Pt also reports Rt sided neck pain and slight headaches.     Pertinent History none   Patient Stated Goals reduce pain, improve Rt UE grip strength   Currently in Pain? Yes   Pain Score 7    Pain Location Arm  numb in hand and fingers on the Rt   Pain Orientation Right   Pain Descriptors / Indicators Tingling;Numbness;Burning   Pain Type Chronic pain   Pain Radiating Towards Rt fingers    Pain Onset More than a month ago   Pain Frequency Constant   Aggravating Factors  worst at night with sleep, increased use of Rt UE, sitting still too long   Pain Relieving Factors sleep on stomach with pillows under chest   Multiple Pain Sites No            [facility] PT Assessment - 08/15/14 0001    Assessment   Medical Diagnosis numbness and tingling of Rt arm (R20.0), cervical spine arthritis (M46.92)   Onset Date 06/14/13   Next MD Visit 09/18/14   Precautions   Precautions None   Restrictions   Weight Bearing Restrictions No   Balance Screen   Has the patient fallen in the past 6 months No   Has the patient had a decrease in activity level because of a fear of falling?  No   Is the patient reluctant to leave their home because of a fear of falling?  No   Home Environment   Living Enviornment Private residence   Prior Function   Level of Independence Independent with basic ADLs   Vocation Retired   Leisure Pt works in his garden   Cognition   Overall Cognitive Status Within [place] for tasks assessed   Observation/Other Assessments   Focus on Therapeutic Outcomes (FOTO)  28% limitation   Posture/Postural Control   Posture/Postural Control Postural limitations   Postural Limitations Rounded Shoulders;Forward head   ROM / Strength   AROM / PROM / Strength AROM;PROM;Strength   AROM   Overall AROM  Deficits   Overall AROM  Comments Cervical AROM: limited by 25% in all directions.  Pt reports stiffness at end range of each motion.  No UE pain with cervical AROM   PROM   Overall PROM  Within functional limits for tasks performed   Strength   Overall Strength Deficits   Overall Strength Comments Rt shoulder 4/5, Lt shoulder 5/5.     Strength Assessment Site Hand   Right/Left hand Right   Grip (lbs) 73#  98# Lt   Palpation   Palpation Pt with palpable tendeness over Rt and Lt cervical paraspinals and UT   Ambulation/Gait   Ambulation/Gait No                             PT Education - 08/15/14 1259    Education provided Yes   Education Details HEP: cervical AROM, grip strength.  Posture education   Person(s) Educated Patient   Methods Explanation;Handout;Demonstration   Comprehension Verbalized  understanding;Returned demonstration          PT Short Term Goals - 08/15/14 1314    PT SHORT TERM GOAL #1   Title be independent in initial HEP   Time 4   Period Weeks   Status New   PT SHORT TERM GOAL #2   Title reduce Rt UE by 25% with use of the Rt UE   Time 4   Period Weeks   Status New   PT SHORT TERM GOAL #3   Title improve Rt grip strength to > or = to 80# to improve use   Time 4   Period Weeks   Status New           PT Long Term Goals - 08/15/14 1315    PT LONG TERM GOAL #1   Title be independent in an advanced HEP   Time 8   Period Weeks   Status New   PT LONG TERM GOAL #2   Title report a 50# reduction in Rt UE radiculopathy with use   Time 8   Period Weeks   Status New   PT LONG TERM GOAL #3   Title sleep with 50% fewer interruptions due to Rt UE pain   Time 8   Status New   PT LONG TERM GOAL #4   Title improve Rt grip strength to > or = to 85# to increase use   Time 8   Period Weeks   Status New               Plan - 08/15/14 1311    Clinical Impression Statement Pt is a Rt hand dominant male who presents with a greater than 1 year history of Rt UE radiculopathy and neck pain.  Pt demonstrates Rt UE weakness and reduced grip strength.  Pt also exhibits cervical stiffness and postural abnormality.  Pt will benefit from PT for postural education, flexibility, traction and modalities for pain control.     Pt will benefit from skilled therapeutic intervention in order to improve on the following deficits Decreased range of motion;Postural dysfunction;Pain;Impaired UE functional use;Decreased strength;Decreased activity tolerance   Rehab Potential Good   PT Frequency 2x / week   PT Duration 8 weeks   PT Treatment/Interventions ADLs/Self Care Home Management;Moist Heat;Therapeutic activities;Therapeutic exercise;Ultrasound;Manual techniques;Traction;Neuromuscular re-education;Electrical Stimulation;Cryotherapy   PT Next Visit Plan cervical  traction, postural re-education, cervical flexibility, cervical flexibility.  Modalities PRN   Consulted and Agree with Plan of Care Patient  Problem List Patient Active Problem List   Diagnosis Date Noted  . Gastroesophageal reflux disease without esophagitis 07/28/2014  . Benign mole 07/28/2014  . Numbness and tingling of right arm 07/28/2014  . Cervical spine arthritis 07/28/2014  . Other male erectile dysfunction 09/23/2013  . Gastroesophageal reflux disease with esophagitis 09/23/2013  . Other malaise and fatigue 07/29/2013  . Cough 03/04/2013  . GERD (gastroesophageal reflux disease) 01/28/2013  . Other and unspecified hyperlipidemia 01/28/2013  . Personal history of colonic adenoma 12/29/2012  . Health maintenance examination 10/07/2012  . Hypogonadism male 10/07/2012  . Right shoulder pain 10/07/2012  . Arthritis 09/08/2012  . Muscle spasm 07/24/2012  . Back spasm 07/24/2012  . Neck pain 07/24/2012    Gaynor Ferreras, PT 08/15/2014, 1:19 PM  [facility] [address] [place], [place], [address] Phone: [phone]   Fax:  [phone]

## 2014-08-18 ENCOUNTER — Encounter: Payer: No Typology Code available for payment source | Admitting: Internal Medicine

## 2014-08-22 ENCOUNTER — Ambulatory Visit: Payer: No Typology Code available for payment source | Attending: Internal Medicine

## 2014-08-23 ENCOUNTER — Ambulatory Visit: Payer: No Typology Code available for payment source | Admitting: Physical Therapy

## 2014-09-06 ENCOUNTER — Encounter: Payer: Self-pay | Admitting: Physical Therapy

## 2014-09-06 ENCOUNTER — Ambulatory Visit: Payer: Self-pay | Attending: Internal Medicine | Admitting: Physical Therapy

## 2014-09-06 DIAGNOSIS — M5410 Radiculopathy, site unspecified: Secondary | ICD-10-CM | POA: Insufficient documentation

## 2014-09-06 DIAGNOSIS — M542 Cervicalgia: Secondary | ICD-10-CM | POA: Insufficient documentation

## 2014-09-06 NOTE — Therapy (Signed)
[facility] [address] [place], [place], [address] Phone: [phone]   Fax:  [phone]  Physical Therapy Treatment  Patient Details  Name: Keith Bruce MRN: [id number] Date of Birth: 1950-10-11 Referring Provider:  Tresa Garter, MD  Encounter Date: 09/06/2014      PT End of Session - 09/06/14 0949    Visit Number 2   Date for PT Re-Evaluation 10/10/14   PT Start Time 0932   PT Stop Time 1020   PT Time Calculation (min) 48 min   Activity Tolerance Patient tolerated treatment well   Behavior During Therapy [facility] for tasks assessed/performed      Past Medical History  Diagnosis Date  . Arthritis   . GERD (gastroesophageal reflux disease)   . Personal history of colonic adenoma 12/29/2012  . Dyspnea   . Cough     Past Surgical History  Procedure Laterality Date  . Back surgery      Age [age]    There were no vitals filed for this visit.  Visit Diagnosis:  Neck pain  Radiculopathy of arm      Subjective Assessment - 09/06/14 0942    Subjective Pt with Rt hand numbness than began 1 year ago. Pt denies headache today, Neck pain results in decreased ROM cervical   Pertinent History none   Patient Stated Goals reduce pain, improve Rt UE grip strength   Currently in Pain? Yes   Pain Score 5    Pain Location Neck  and radiating pain into Rt UE   Pain Orientation Right   Pain Descriptors / Indicators Tingling;Numbness;Burning   Pain Type Chronic pain   Pain Radiating Towards Rt fingers   Pain Onset More than a month ago   Pain Frequency Constant   Aggravating Factors  Worse at night with prolonged positions, increased activity Rt, UE, prolonged sitting   Multiple Pain Sites No                         [facility] Adult PT Treatment/Exercise - 09/06/14 0001    Exercises   Exercises Neck   Neck Exercises: Seated   Cervical Rotation Both;Other (comment)  x 3 for assesment before  and during treatment   Neck Exercises: Supine   Canalith Repositioning Other (comment)   Canalith Repositioning Limitations Foam roll for elongation and decompression 2 x 2 min  wih UE into flexion to incr thoracic extension, and circles    Other Supine Exercise Trunk rotation with head to opposite side x 3 with 20 sec hold each side   Other Supine Exercise Thoracic selfmobilization with towel   Modalities   Modalities Traction   Traction   Type of Traction Cervical   Min (lbs) 5   Max (lbs) 17   Hold Time 60   Rest Time 10   Time 15                PT Education - 09/06/14 1010    Education provided Yes   Education Details thoracic self mobilization in supine   Person(s) Educated Patient   Methods Explanation;Demonstration;Handout   Comprehension Verbalized understanding;Returned demonstration          PT Short Term Goals - 09/06/14 0954    PT SHORT TERM GOAL #1   Title be independent in initial HEP   Time 4   Period Weeks   Status On-going   PT SHORT TERM GOAL #2  Title reduce Rt UE by 25% with use of the Rt UE   Time 4   Period Weeks   Status On-going   PT SHORT TERM GOAL #3   Title improve Rt grip strength to > or = to 80# to improve use   Time 4   Period Weeks   Status On-going           PT Long Term Goals - 09/06/14 0955    PT LONG TERM GOAL #1   Title be independent in an advanced HEP   Time 8   Period Weeks   Status On-going   PT LONG TERM GOAL #2   Title report a 50# reduction in Rt UE radiculopathy with use   Time 8   Period Weeks   PT LONG TERM GOAL #3   Status On-going   PT LONG TERM GOAL #4   Title improve Rt grip strength to > or = to 85# to increase use   Time 8   Period Weeks   Status On-going               Plan - 09/06/14 0949    Clinical Impression Statement Pt with radiating pain into Rt hand, and decreased ROM cervical spine. Rt UE weakness and reduced grip strength. Pt will benefit from PT for postural  education, flexibility, traction and modalities for pain control   Pt will benefit from skilled therapeutic intervention in order to improve on the following deficits Decreased range of motion;Postural dysfunction;Pain;Impaired UE functional use;Decreased strength;Decreased activity tolerance   Rehab Potential Good   PT Frequency 2x / week   PT Duration 8 weeks   PT Treatment/Interventions ADLs/Self Care Home Management;Moist Heat;Therapeutic activities;Therapeutic exercise;Ultrasound;Manual techniques;Traction;Neuromuscular re-education;Electrical Stimulation;Cryotherapy   PT Next Visit Plan practice self mob with towel in sitting, cervical traction, postural re-education, cervical flexibility, cervical flexibility.  Modalities PRN   Consulted and Agree with Plan of Care Patient        Problem List Patient Active Problem List   Diagnosis Date Noted  . Gastroesophageal reflux disease without esophagitis 07/28/2014  . Benign mole 07/28/2014  . Numbness and tingling of right arm 07/28/2014  . Cervical spine arthritis 07/28/2014  . Other male erectile dysfunction 09/23/2013  . Gastroesophageal reflux disease with esophagitis 09/23/2013  . Other malaise and fatigue 07/29/2013  . Cough 03/04/2013  . GERD (gastroesophageal reflux disease) 01/28/2013  . Other and unspecified hyperlipidemia 01/28/2013  . Personal history of colonic adenoma 12/29/2012  . Health maintenance examination 10/07/2012  . Hypogonadism male 10/07/2012  . Right shoulder pain 10/07/2012  . Arthritis 09/08/2012  . Muscle spasm 07/24/2012  . Back spasm 07/24/2012  . Neck pain 07/24/2012    NAUMANN-HOUEGNIFIO,Keddrick Wyne PTA 09/06/2014, 10:17 AM  [facility] [address] [place], [place], [address] Phone: [phone]   Fax:  [phone]

## 2014-09-06 NOTE — Patient Instructions (Signed)
Thoracic Self-Mobilization (Sitting)   With small rolled towel at lower ribs level, gently lean back until stretch is felt. Hold 20 seconds. Relax. Repeat __5__ times per set. Do _2 sets per session. Do _2-3 ___ sessions per day.  http://orth.exer.us/998   Copyright  VHI. All rights reserved.  Thoracic Self-Mobilization Stretch (Supine)   With small rolled towel at bra area, gently lie back until stretch is felt. Hold for 2 min. Repeat ___1-2  times per day.   http://orth.exer.us/994   Copyright  VHI. All rights reserved.

## 2014-09-08 ENCOUNTER — Encounter: Payer: Self-pay | Admitting: Physical Therapy

## 2014-09-08 ENCOUNTER — Ambulatory Visit: Payer: Self-pay | Admitting: Physical Therapy

## 2014-09-08 DIAGNOSIS — M542 Cervicalgia: Secondary | ICD-10-CM

## 2014-09-08 DIAGNOSIS — M5410 Radiculopathy, site unspecified: Secondary | ICD-10-CM

## 2014-09-08 NOTE — Therapy (Signed)
[facility] [address], [place], [place], [address] Phone: [phone]   Fax:  [phone]  Physical Therapy Treatment  Patient Details  Name: Keith Bruce MRN: [id number] Date of Birth: 1950-05-16 Referring Provider:  Tresa Garter, MD  Encounter Date: 09/08/2014      PT End of Session - 09/08/14 0910    Visit Number 3   Date for PT Re-Evaluation 10/10/14   PT Start Time 0845   PT Stop Time 0930   PT Time Calculation (min) 45 min   Activity Tolerance Patient tolerated treatment well   Behavior During Therapy [facility] for tasks assessed/performed      Past Medical History  Diagnosis Date  . Arthritis   . GERD (gastroesophageal reflux disease)   . Personal history of colonic adenoma 12/29/2012  . Dyspnea   . Cough     Past Surgical History  Procedure Laterality Date  . Back surgery      Age [age]    There were no vitals filed for this visit.  Visit Diagnosis:  Neck pain  Radiculopathy of arm      Subjective Assessment - 09/08/14 0852    Subjective Pt reports only little headache, he noticed his ROM in cervical imporved, but no change with numbness in Rt hand   Currently in Pain? Yes   Pain Score 5    Pain Location Neck   Pain Orientation Right   Pain Descriptors / Indicators Tingling;Numbness;Burning   Pain Type Chronic pain   Pain Radiating Towards Rt fingers   Pain Onset More than a month ago   Pain Frequency Constant   Multiple Pain Sites No                         [facility] Adult PT Treatment/Exercise - 09/08/14 0001    Exercises   Exercises Neck   Neck Exercises: Machines for Strengthening   UBE (Upper Arm Bike) L 2 37min (3/3) with cervical rotatiion 2 x10   Neck Exercises: Standing   Neck Retraction 10 reps;3 secs   Other Standing Exercises unilat rows with 10# 10 reps  with trunk forward lean   Other Standing Exercises UE bil extension 2 x 10   Modalities   Modalities Traction   Traction   Type of Traction Cervical   Min (lbs) 5   Max (lbs) 18   Hold Time 60   Rest Time 10   Time 15   Manual Therapy   Manual Therapy Soft tissue mobilization   Soft tissue mobilization to bil cervical paraspinals & bil UT                PT Education - 09/08/14 0909    Education provided Yes   Education Details PNF with red t-band bil extension, bil abd   Person(s) Educated Patient   Methods Explanation;Demonstration;Handout   Comprehension Returned demonstration;Verbalized understanding          PT Short Term Goals - 09/06/14 0954    PT SHORT TERM GOAL #1   Title be independent in initial HEP   Time 4   Period Weeks   Status On-going   PT SHORT TERM GOAL #2   Title reduce Rt UE by 25% with use of the Rt UE   Time 4   Period Weeks   Status On-going   PT SHORT TERM GOAL #3   Title improve Rt grip strength to > or = to  80# to improve use   Time 4   Period Weeks   Status On-going           PT Long Term Goals - 09/06/14 0955    PT LONG TERM GOAL #1   Title be independent in an advanced HEP   Time 8   Period Weeks   Status On-going   PT LONG TERM GOAL #2   Title report a 50# reduction in Rt UE radiculopathy with use   Time 8   Period Weeks   PT LONG TERM GOAL #3   Status On-going   PT LONG TERM GOAL #4   Title improve Rt grip strength to > or = to 85# to increase use   Time 8   Period Weeks   Status On-going               Plan - 09/08/14 0910    Clinical Impression Statement Pt presents with incr ROM cervical today compare to last visit. Continues Rt UE weakness and reduced grip strength. Pt reports no change with radiating pain in Rt hand and numbness.   Pt will benefit from skilled therapeutic intervention in order to improve on the following deficits Decreased range of motion;Postural dysfunction;Pain;Impaired UE functional use;Decreased strength;Decreased activity tolerance   Rehab Potential Good   PT  Frequency 2x / week   PT Duration 8 weeks   PT Treatment/Interventions ADLs/Self Care Home Management;Moist Heat;Therapeutic activities;Therapeutic exercise;Ultrasound;Manual techniques;Traction;Neuromuscular re-education;Electrical Stimulation;Cryotherapy   PT Next Visit Plan practice self mob with towel in sitting, cervical traction, postural re-education, cervical flexibility, cervical flexibility.  Modalities PRN   Consulted and Agree with Plan of Care Patient        Problem List Patient Active Problem List   Diagnosis Date Noted  . Gastroesophageal reflux disease without esophagitis 07/28/2014  . Benign mole 07/28/2014  . Numbness and tingling of right arm 07/28/2014  . Cervical spine arthritis 07/28/2014  . Other male erectile dysfunction 09/23/2013  . Gastroesophageal reflux disease with esophagitis 09/23/2013  . Other malaise and fatigue 07/29/2013  . Cough 03/04/2013  . GERD (gastroesophageal reflux disease) 01/28/2013  . Other and unspecified hyperlipidemia 01/28/2013  . Personal history of colonic adenoma 12/29/2012  . Health maintenance examination 10/07/2012  . Hypogonadism male 10/07/2012  . Right shoulder pain 10/07/2012  . Arthritis 09/08/2012  . Muscle spasm 07/24/2012  . Back spasm 07/24/2012  . Neck pain 07/24/2012    NAUMANN-HOUEGNIFIO,Kreed Kauffman PTA 09/08/2014, 9:35 AM  [facility] [address] [place], [place], [address] Phone: [phone]   Fax:  [phone]

## 2014-09-08 NOTE — Patient Instructions (Addendum)
   PNF Strengthening: Resisted   Standing with resistive band around each hand, bring right arm up and away, thumb back. Repeat _10___ times per set. Do _2___ sets per session. Do _1-2___ sessions per day.      Resisted Horizontal Abduction: Bilateral   Sit or stand, tubing in both hands, arms out in front. Keeping arms straight, pinch shoulder blades together and stretch arms out. Repeat _10___ times per set. Do 2____ sets per session. Do _1-2___ sessions per day.                  Scapular Retraction: Elbow Flexion (Standing)   With elbows bent to 90, pinch shoulder blades together and rotate arms out, keeping elbows bent. Repeat _10___ times per set. Do _1___ sets per session. Do many____ sessions per day.    Strengthening: Resisted Extension   Hold tubing in right hand, arm forward. Pull arm back, elbow straight. Repeat _10___ times per set. Do _2___ sets per session. Do _1-2___ sessions per day.  Can place band around the front of your body and perform with both arms at the same time.

## 2014-09-14 ENCOUNTER — Ambulatory Visit: Payer: Self-pay | Admitting: Physical Therapy

## 2014-09-14 ENCOUNTER — Encounter: Payer: Self-pay | Admitting: Physical Therapy

## 2014-09-14 DIAGNOSIS — M5410 Radiculopathy, site unspecified: Secondary | ICD-10-CM

## 2014-09-14 DIAGNOSIS — M542 Cervicalgia: Secondary | ICD-10-CM

## 2014-09-14 NOTE — Therapy (Signed)
[facility] [address] [place], [place], [address] Phone: [phone]   Fax:  [phone]  Physical Therapy Treatment  Patient Details  Name: Keith Bruce MRN: [id number] Date of Birth: 06-16-1950 Referring Provider:  Tresa Garter, MD  Encounter Date: 09/14/2014      PT End of Session - 09/14/14 0956    Visit Number 4   Date for PT Re-Evaluation 10/10/14   PT Start Time 0931   PT Stop Time 1032   PT Time Calculation (min) 61 min   Activity Tolerance Patient tolerated treatment well   Behavior During Therapy [facility] for tasks assessed/performed      Past Medical History  Diagnosis Date  . Arthritis   . GERD (gastroesophageal reflux disease)   . Personal history of colonic adenoma 12/29/2012  . Dyspnea   . Cough     Past Surgical History  Procedure Laterality Date  . Back surgery      Age 64-35    There were no vitals filed for this visit.  Visit Diagnosis:  Neck pain  Radiculopathy of arm      Subjective Assessment - 09/14/14 0937    Subjective Reports only little headache, noticed some improvement with the numbness in Rt hand   Patient Stated Goals reduce pain, improve Rt UE grip strength   Currently in Pain? Yes   Pain Score 4    Pain Location Neck   Pain Orientation Right   Pain Descriptors / Indicators Tingling;Numbness;Burning   Pain Type Chronic pain   Multiple Pain Sites No                         [facility] Adult PT Treatment/Exercise - 09/14/14 0001    Exercises   Exercises Neck   Neck Exercises: Machines for Strengthening   UBE (Upper Arm Bike) L 2 47min (3/3) with cervical rotatiion 2 x10   Neck Exercises: Standing   Other Standing Exercises unilat rows with 10# 10 reps  with trunk forward lean   Other Standing Exercises UE bil extension 2 x 10   Neck Exercises: Supine   Canalith Repositioning Limitations Foam roll for elongation and decompression 2 x 2 min   with circles of UE, overhead flexion, cervical retraction   Other Supine Exercise Trunk rotation with head to opposite side x 3 with 20 sec hold each side   Other Supine Exercise Thoracic selfmobilization with towel   Modalities   Modalities Traction   Traction   Type of Traction Cervical   Min (lbs) 5   Max (lbs) 18   Hold Time 60   Rest Time 10   Time 15   Manual Therapy   Manual Therapy Soft tissue mobilization   Soft tissue mobilization to bil cervical paraspinals & bil UT                  PT Short Term Goals - 09/14/14 1001    PT SHORT TERM GOAL #1   Title be independent in initial HEP   Time 4   Period Weeks   Status Achieved   PT SHORT TERM GOAL #2   Title reduce Rt UE by 25% with use of the Rt UE   Time 4   Period Weeks   Status Achieved   PT SHORT TERM GOAL #3   Title improve Rt grip strength to > or = to 80# to improve use  80#, 84# and 90#  Time 4   Period Weeks   Status Achieved           PT Long Term Goals - 09/14/14 1003    PT LONG TERM GOAL #1   Title be independent in an advanced HEP   Time 8   Period Weeks   Status On-going   PT LONG TERM GOAL #2   Title report a 50# reduction in Rt UE radiculopathy with use   Time 8   Period Weeks   Status On-going   PT LONG TERM GOAL #3   Title sleep with 50% fewer interruptions due to Rt UE pain  reports 40-50% better   Time 8   Period Weeks   Status Partially Met   PT LONG TERM GOAL #4   Title improve Rt grip strength to > or = to 85# to increase use  up to 90# but inconsistent   Time 8   Period Weeks   Status Partially Met               Plan - 09/14/14 1000    Clinical Impression Statement Pt tolerated activities well, Handdynamometer was able to push 80#, 84# and 90# with Rt hand, Pt will continue to benefit from skilled PT   Pt will benefit from skilled therapeutic intervention in order to improve on the following deficits Decreased range of motion;Postural  dysfunction;Pain;Impaired UE functional use;Decreased strength;Decreased activity tolerance   PT Frequency 2x / week   PT Duration 8 weeks   PT Treatment/Interventions ADLs/Self Care Home Management;Moist Heat;Therapeutic activities;Therapeutic exercise;Ultrasound;Manual techniques;Traction;Neuromuscular re-education;Electrical Stimulation;Cryotherapy   PT Next Visit Plan practice self mob with towel in sitting, cervical traction, postural re-education, cervical flexibility, cervical flexibility.  Modalities PRN   Consulted and Agree with Plan of Care Patient        Problem List Patient Active Problem List   Diagnosis Date Noted  . Gastroesophageal reflux disease without esophagitis 07/28/2014  . Benign mole 07/28/2014  . Numbness and tingling of right arm 07/28/2014  . Cervical spine arthritis 07/28/2014  . Other male erectile dysfunction 09/23/2013  . Gastroesophageal reflux disease with esophagitis 09/23/2013  . Other malaise and fatigue 07/29/2013  . Cough 03/04/2013  . GERD (gastroesophageal reflux disease) 01/28/2013  . Other and unspecified hyperlipidemia 01/28/2013  . Personal history of colonic adenoma 12/29/2012  . Health maintenance examination 10/07/2012  . Hypogonadism male 10/07/2012  . Right shoulder pain 10/07/2012  . Arthritis 09/08/2012  . Muscle spasm 07/24/2012  . Back spasm 07/24/2012  . Neck pain 07/24/2012    NAUMANN-HOUEGNIFIO,Mikalia Fessel PTA 09/14/2014, 10:21 AM  [facility] [address], [place], [place], [address] Phone: [phone]   Fax:  [phone]

## 2014-09-21 ENCOUNTER — Encounter: Payer: Self-pay | Admitting: Physical Therapy

## 2014-09-21 ENCOUNTER — Ambulatory Visit: Payer: Self-pay | Admitting: Physical Therapy

## 2014-09-21 DIAGNOSIS — M5410 Radiculopathy, site unspecified: Secondary | ICD-10-CM

## 2014-09-21 DIAGNOSIS — M542 Cervicalgia: Secondary | ICD-10-CM

## 2014-09-21 NOTE — Therapy (Signed)
[facility] [address] [place], [place], [address] Phone: [phone]   Fax:  [phone]  Physical Therapy Treatment  Patient Details  Name: Keith Bruce MRN: [id number] Date of Birth: [birth year] Referring Provider:  Tresa Garter, MD  Encounter Date: 09/21/2014      PT End of Session - 09/21/14 1107    Visit Number 5   Date for PT Re-Evaluation 10/10/14   PT Start Time 1014   PT Stop Time 1059   PT Time Calculation (min) 45 min   Activity Tolerance Patient tolerated treatment well   Behavior During Therapy [facility] for tasks assessed/performed      Past Medical History  Diagnosis Date  . Arthritis   . GERD (gastroesophageal reflux disease)   . Personal history of colonic adenoma 12/29/2012  . Dyspnea   . Cough     Past Surgical History  Procedure Laterality Date  . Back surgery      Age [age]    There were no vitals filed for this visit.  Visit Diagnosis:  Neck pain  Radiculopathy of arm      Subjective Assessment - 09/21/14 1019    Subjective Headache is gone, pt was able to drive for extented hours what caused only increased stiffness in neck   Pain Score 3    Pain Location Neck   Pain Orientation Right;Mid  and some tingling in Rt hand    Pain Descriptors / Indicators Tingling;Tightness   Pain Type Chronic pain   Pain Onset More than a month ago   Pain Frequency Constant   Aggravating Factors  driving and sitting for prolonged time   Pain Relieving Factors stretching and changing positions   Multiple Pain Sites No                         [facility] Adult PT Treatment/Exercise - 09/21/14 0001    Exercises   Exercises Neck;Shoulder   Neck Exercises: Machines for Strengthening   UBE (Upper Arm Bike) L 4 66mn (3/3) with cervical rotatiion 2 x10   Neck Exercises: Standing   Neck Retraction 10 reps;3 secs   Other Standing Exercises UE bil extension 2 x 10   Neck  Exercises: Supine   Canalith Repositioning Limitations Foam roll for elongation and decompression 2 x 2 min  using red-tband abd x 10, D2 x 10   Shoulder Exercises: Stretch   Corner Stretch 3 reps;20 seconds;Other (comment)  3 x 2 with Lt & Rt LE in front   Modalities   Modalities Ultrasound   Ultrasound   Ultrasound Location cervical spine  in prone position   Ultrasound Parameters 100%, 1 MHz, 1W/cm x 6 min   Ultrasound Goals Pain  numbness   Manual Therapy   Manual Therapy Soft tissue mobilization   Soft tissue mobilization to bil cervical paraspinals & bil UT                PT Education - 09/21/14 1038    Education provided Yes   Education Details Shoulder opener in doorframe   Person(s) Educated Patient   Methods Explanation;Demonstration;Handout   Comprehension Verbalized understanding;Returned demonstration          PT Short Term Goals - 09/14/14 1001    PT SHORT TERM GOAL #1   Title be independent in initial HEP   Time 4   Period Weeks   Status Achieved   PT SHORT TERM GOAL #2  Title reduce Rt UE by 25% with use of the Rt UE   Time 4   Period Weeks   Status Achieved   PT SHORT TERM GOAL #3   Title improve Rt grip strength to > or = to 80# to improve use  80#, 84# and 90#   Time 4   Period Weeks   Status Achieved           PT Long Term Goals - 09/14/14 1003    PT LONG TERM GOAL #1   Title be independent in an advanced HEP   Time 8   Period Weeks   Status On-going   PT LONG TERM GOAL #2   Title report a 50# reduction in Rt UE radiculopathy with use   Time 8   Period Weeks   Status On-going   PT LONG TERM GOAL #3   Title sleep with 50% fewer interruptions due to Rt UE pain  reports 40-50% better   Time 8   Period Weeks   Status Partially Met   PT LONG TERM GOAL #4   Title improve Rt grip strength to > or = to 85# to increase use  up to 90# but inconsistent   Time 8   Period Weeks   Status Partially Met                Plan - 09/21/14 1108    Clinical Impression Statement pt presents with improved quality of ROM in cervical spine, able to tolerate execises well. Pt will continue to benefit from skilled PT   Pt will benefit from skilled therapeutic intervention in order to improve on the following deficits Decreased range of motion;Postural dysfunction;Pain;Impaired UE functional use;Decreased strength;Decreased activity tolerance   Rehab Potential Good   PT Frequency 2x / week   PT Duration 8 weeks   PT Treatment/Interventions ADLs/Self Care Home Management;Moist Heat;Therapeutic activities;Therapeutic exercise;Ultrasound;Manual techniques;Traction;Neuromuscular re-education;Electrical Stimulation;Cryotherapy   PT Next Visit Plan practice self mob with towel in sitting, cervical traction, postural re-education, cervical flexibility, cervical flexibility.  Modalities PRN   Consulted and Agree with Plan of Care Patient        Problem List Patient Active Problem List   Diagnosis Date Noted  . Gastroesophageal reflux disease without esophagitis 07/28/2014  . Benign mole 07/28/2014  . Numbness and tingling of right arm 07/28/2014  . Cervical spine arthritis 07/28/2014  . Other male erectile dysfunction 09/23/2013  . Gastroesophageal reflux disease with esophagitis 09/23/2013  . Other malaise and fatigue 07/29/2013  . Cough 03/04/2013  . GERD (gastroesophageal reflux disease) 01/28/2013  . Other and unspecified hyperlipidemia 01/28/2013  . Personal history of colonic adenoma 12/29/2012  . Health maintenance examination 10/07/2012  . Hypogonadism male 10/07/2012  . Right shoulder pain 10/07/2012  . Arthritis 09/08/2012  . Muscle spasm 07/24/2012  . Back spasm 07/24/2012  . Neck pain 07/24/2012    NAUMANN-HOUEGNIFIO,Shamera Yarberry PTA 09/21/2014, 11:11 AM   [facility] [address] [place], [place], [address] Phone: [phone]   Fax:   [phone]

## 2014-09-21 NOTE — Patient Instructions (Signed)
.  Chest Flexibility: Shoulder Opener (Doorframe)  Place your forearms on doorframe, gentle leaning forward against doorframe. Bathroom door is good since it is smaller Hold for 20 seconds. Repeat  3 times place other leg forward and repeat x 3 with 20 sec hold.  Copyright  VHI. All rights reserved.

## 2014-09-28 ENCOUNTER — Encounter: Payer: Self-pay | Admitting: Physical Therapy

## 2014-09-28 ENCOUNTER — Ambulatory Visit: Payer: Self-pay | Admitting: Physical Therapy

## 2014-09-28 DIAGNOSIS — M542 Cervicalgia: Secondary | ICD-10-CM

## 2014-09-28 DIAGNOSIS — M5410 Radiculopathy, site unspecified: Secondary | ICD-10-CM

## 2014-09-28 NOTE — Therapy (Addendum)
[facility] [address] [place], [place], [address] Phone: [phone]   Fax:  [phone]  Physical Therapy Treatment  Patient Details  Name: Keith Bruce MRN: [id number] Date of Birth: Aug 15, 1950 Referring Provider:  Tresa Garter, MD  Encounter Date: 09/28/2014      PT End of Session - 09/28/14 1227    Visit Number 6   Date for PT Re-Evaluation 10/10/14   PT Start Time 1147   PT Stop Time 1230   PT Time Calculation (min) 43 min   Activity Tolerance Patient tolerated treatment well   Behavior During Therapy [facility] for tasks assessed/performed      Past Medical History  Diagnosis Date  . Arthritis   . GERD (gastroesophageal reflux disease)   . Personal history of colonic adenoma 12/29/2012  . Dyspnea   . Cough     Past Surgical History  Procedure Laterality Date  . Back surgery      Age 50-35    There were no vitals filed for this visit.  Visit Diagnosis:  Neck pain  Radiculopathy of arm      Subjective Assessment - 09/28/14 1153    Subjective Neck feels better, tingling continues in "whole hand."    Currently in Pain? Yes   Pain Score 2    Pain Location Neck   Pain Orientation Right   Aggravating Factors  At night, driving   Pain Relieving Factors Stretching, changing positions   Multiple Pain Sites No                         [facility] Adult PT Treatment/Exercise - 09/28/14 0001    Neck Exercises: Machines for Strengthening   UBE (Upper Arm Bike) L5 3x3   Neck Exercises: Supine   Canalith Repositioning Limitations Foam roll for elongation and decompression 2 x 2 min  using red-tband abd x 10, D2 x 10   Other Supine Exercise MELT techniques for  cervical decompression    Shoulder Exercises: Stretch   Corner Stretch 3 reps;20 seconds   Corner Stretch Limitations VC to relax UT   Ultrasound   Ultrasound Location RT cervical   Ultrasound Parameters 100% 1.2 wt/cms    Ultrasound Goals Pain                  PT Short Term Goals - 09/28/14 1206    PT SHORT TERM GOAL #1   Title be independent in initial HEP   Time 4   Period Weeks   Status Achieved   PT SHORT TERM GOAL #2   Title reduce Rt UE by 25% with use of the Rt UE   Time 4   Status Achieved   PT SHORT TERM GOAL #3   Title improve Rt grip strength to > or = to 80# to improve use   Time 4   Period Weeks   Status Achieved           PT Long Term Goals - 09/28/14 1207    PT LONG TERM GOAL #1   Title be independent in an advanced HEP   Time 8   Period Weeks   Status On-going   PT LONG TERM GOAL #2   Title report a 50# reduction in Rt UE radiculopathy with use   Time 8   Period Weeks   Status On-going   PT LONG TERM GOAL #3   Title sleep with 50% fewer interruptions due  to Rt UE pain   Time 8   Period Weeks   Status Partially Met  Still variable   PT LONG TERM GOAL #4   Title improve Rt grip strength to > or = to 85# to increase use   Time 8   Period Weeks   Status Partially Met               Plan - 09/28/14 1228    Clinical Impression Statement RT hand remains with tingling which has increased he reports in th elast few days. Otherwise, he reports neck pain is much better. he performed all exercises with excellent form and no pain. Still painful at night.    Pt will benefit from skilled therapeutic intervention in order to improve on the following deficits Decreased range of motion;Postural dysfunction;Pain;Impaired UE functional use;Decreased strength;Decreased activity tolerance   Rehab Potential Good   PT Frequency 2x / week   PT Duration 8 weeks   PT Treatment/Interventions ADLs/Self Care Home Management;Moist Heat;Therapeutic activities;Therapeutic exercise;Ultrasound;Manual techniques;Traction;Neuromuscular re-education;Electrical Stimulation;Cryotherapy   PT Next Visit Plan Try green band on foam roll, decompression techniques, [place]   Consulted and Agree  with Plan of Care Patient        Problem List Patient Active Problem List   Diagnosis Date Noted  . Gastroesophageal reflux disease without esophagitis 07/28/2014  . Benign mole 07/28/2014  . Numbness and tingling of right arm 07/28/2014  . Cervical spine arthritis 07/28/2014  . Other male erectile dysfunction 09/23/2013  . Gastroesophageal reflux disease with esophagitis 09/23/2013  . Other malaise and fatigue 07/29/2013  . Cough 03/04/2013  . GERD (gastroesophageal reflux disease) 01/28/2013  . Other and unspecified hyperlipidemia 01/28/2013  . Personal history of colonic adenoma 12/29/2012  . Health maintenance examination 10/07/2012  . Hypogonadism male 10/07/2012  . Right shoulder pain 10/07/2012  . Arthritis 09/08/2012  . Muscle spasm 07/24/2012  . Back spasm 07/24/2012  . Neck pain 07/24/2012    COCHRAN,JENNIFER, PTA 09/28/2014, 12:30 PM PHYSICAL THERAPY DISCHARGE SUMMARY  Visits from Start of Care: 6  Current functional level related to goals / functional outcomes: See above for most current goal assessment.  Pt called to cancel final treatment as he had a conflict.  He reported that neck pain has resolved by has not had change in his UE radiculopathy.  Pt would like to see MD to discuss continued pain.     Remaining deficits: Continued Rt UE radiculopathy that disrupts sleep and function of Rt UE.  Rt grip was not formally tested as pt didn't come to final PT session.  Rt UE was weak when last tested.     Education / Equipment: HEP, Radiographer, therapeutic Plan: Patient agrees to discharge.  Patient goals were partially met. Patient is being discharged due to lack of progress.  ?????   Sigurd Sos, PT 10/12/2014 8:28 AM  [facility] [address] [place], [place], [address] Phone: [phone]   Fax:  [phone]

## 2014-10-12 ENCOUNTER — Ambulatory Visit: Payer: Self-pay

## 2014-11-11 ENCOUNTER — Other Ambulatory Visit: Payer: Self-pay | Admitting: Internal Medicine

## 2014-12-06 ENCOUNTER — Telehealth: Payer: Self-pay | Admitting: Internal Medicine

## 2014-12-06 NOTE — Telephone Encounter (Signed)
Patient came in requesting a refill for the medicine for Acid reflux. Please follow up.

## 2014-12-19 ENCOUNTER — Ambulatory Visit: Payer: Self-pay | Attending: Internal Medicine

## 2014-12-22 ENCOUNTER — Emergency Department (HOSPITAL_COMMUNITY): Payer: Self-pay

## 2014-12-22 ENCOUNTER — Encounter (HOSPITAL_COMMUNITY): Payer: Self-pay | Admitting: Emergency Medicine

## 2014-12-22 ENCOUNTER — Emergency Department (HOSPITAL_COMMUNITY)
Admission: EM | Admit: 2014-12-22 | Discharge: 2014-12-22 | Disposition: A | Discharge status: Home / Self Care | Payer: Self-pay | Attending: Emergency Medicine | Admitting: Emergency Medicine

## 2014-12-22 DIAGNOSIS — X58XXXA Exposure to other specified factors, initial encounter: Secondary | ICD-10-CM | POA: Insufficient documentation

## 2014-12-22 DIAGNOSIS — Z86010 Personal history of colonic polyps: Secondary | ICD-10-CM | POA: Insufficient documentation

## 2014-12-22 DIAGNOSIS — Z7951 Long term (current) use of inhaled steroids: Secondary | ICD-10-CM | POA: Insufficient documentation

## 2014-12-22 DIAGNOSIS — Y998 Other external cause status: Secondary | ICD-10-CM | POA: Insufficient documentation

## 2014-12-22 DIAGNOSIS — M1990 Unspecified osteoarthritis, unspecified site: Secondary | ICD-10-CM | POA: Insufficient documentation

## 2014-12-22 DIAGNOSIS — Y9289 Other specified places as the place of occurrence of the external cause: Secondary | ICD-10-CM | POA: Insufficient documentation

## 2014-12-22 DIAGNOSIS — K219 Gastro-esophageal reflux disease without esophagitis: Secondary | ICD-10-CM | POA: Insufficient documentation

## 2014-12-22 DIAGNOSIS — S46911A Strain of unspecified muscle, fascia and tendon at shoulder and upper arm level, right arm, initial encounter: Secondary | ICD-10-CM | POA: Insufficient documentation

## 2014-12-22 DIAGNOSIS — Y9389 Activity, other specified: Secondary | ICD-10-CM | POA: Insufficient documentation

## 2014-12-22 DIAGNOSIS — Z79899 Other long term (current) drug therapy: Secondary | ICD-10-CM | POA: Insufficient documentation

## 2014-12-22 MED ORDER — CYCLOBENZAPRINE HCL 5 MG PO TABS: 5.0000 mg | ORAL_TABLET | Freq: Three times a day (TID) | ORAL | Status: DC | PRN

## 2014-12-22 NOTE — Discharge Instructions (Signed)

## 2014-12-22 NOTE — ED Provider Notes (Signed)
CSN: [id number]     Arrival date & time 12/22/14  1203 History  This chart was scribed for non-physician practitioner, Glendell Docker, NP working with Charlesetta Shanks, MD by Evelene Croon, ED Scribe. This patient was seen in room WTR8/WTR8 and the patient's care was started at 12:22 PM.  Chief Complaint  Patient presents with  . Shoulder Pain    right   The history is provided by the patient. No language interpreter was used.    HPI Comments:  Keith Bruce is a 64 y.o. male with a history of arthritis, who presents to the Emergency Department complaining of constant right shoulder pain for 5 days. Pt states he felt a pop after picking up a box of [place]. He has been taking aleve with minimal relief. Pt also reports chronic right sided neck pain and intermittent RUE numbness for ~ 6-8 months. Pt denies numbness at this time. He has been evaluated at the wellness clinic for his neck pain but states he can not get in to be seen again until October 2016.   Past Medical History  Diagnosis Date  . Arthritis   . GERD (gastroesophageal reflux disease)   . Personal history of colonic adenoma 12/29/2012  . Dyspnea   . Cough    Past Surgical History  Procedure Laterality Date  . Back surgery      Age 64-35   Family History  Problem Relation Age of Onset  . Heart disease Mother   . Kidney disease Father   . Asthma      Half sister   Social History  Substance Use Topics  . Smoking status: Never Smoker   . Smokeless tobacco: Never Used  . Alcohol Use: 1.8 - 2.4 oz/week    3-4 Cans of beer per week     Comment: 3-4 beers a week but hasnt had any in last 2 months.     Review of Systems  Constitutional: Negative for fever and chills.  Musculoskeletal: Positive for myalgias, arthralgias and neck pain.  Neurological: Positive for numbness.  All other systems reviewed and are negative.    Allergies  Review of patient's allergies indicates no known allergies.  Home Medications    Prior to Admission medications   Medication Sig Start Date End Date Taking? Authorizing Provider  acetaminophen-codeine (TYLENOL #3) 300-30 MG per tablet Take 1 tablet by mouth every 4 (four) hours as needed. 07/28/14   Tresa Garter, MD  beclomethasone (QVAR) 80 MCG/ACT inhaler Inhale 2 puffs into the lungs daily. 06/14/13   Elsie Stain, MD  beclomethasone (QVAR) 80 MCG/ACT inhaler Inhale 2 puffs into the lungs daily. 07/29/14   Elsie Stain, MD  gabapentin (NEURONTIN) 100 MG capsule Take 1 capsule (100 mg total) by mouth 3 (three) times daily. 07/28/14   Tresa Garter, MD  HYDROcodone-homatropine (HYCODAN) 5-1.5 MG/5ML syrup Take 5 mLs by mouth every 6 (six) hours as needed for cough. 09/23/13   Tresa Garter, MD  ibuprofen (ADVIL,MOTRIN) 800 MG tablet Take 1 tablet (800 mg total) by mouth every 8 (eight) hours as needed for mild pain. 08/19/13   Kristen N Ward, DO  omeprazole (PRILOSEC) 40 MG capsule Take 1 capsule (40 mg total) by mouth daily. 09/23/13   Tresa Garter, MD  oxyCODONE-acetaminophen (PERCOCET/ROXICET) 5-325 MG per tablet Take 1 tablet by mouth every 4 (four) hours as needed. 08/19/13   Kristen N Ward, DO  sildenafil (VIAGRA) 100 MG tablet Take 0.5-1 tablets (50-100 mg total)  by mouth daily as needed for erectile dysfunction. 12/03/13   Tresa Garter, MD  sucralfate (CARAFATE) 1 G tablet Take 1 tablet (1 g total) by mouth 4 (four) times daily -  before meals and at bedtime. 03/04/13   Tresa Garter, MD   BP 124/83 mmHg  Pulse 70  Temp(Src) 98.3 F (36.8 C) (Oral)  Resp 18  SpO2 98% Physical Exam  Constitutional: He is oriented to person, place, and time. He appears well-developed and well-nourished. No distress.  HENT:  Head: Normocephalic and atraumatic.  Eyes: Conjunctivae are normal.  Neck: Normal range of motion.  Cardiovascular: Normal rate.   Pulmonary/Chest: Effort normal and breath sounds normal.  Abdominal: Soft. Bowel sounds  are normal.  Musculoskeletal: Normal range of motion.  Neurological: He is alert and oriented to person, place, and time. He exhibits normal muscle tone. Coordination normal.  Tender in the right anterior shoulder. Tender in there right lateral neck. Equal grip strength  Skin: Skin is warm and dry.  Nursing note and vitals reviewed.   ED Course  Procedures   DIAGNOSTIC STUDIES:  Oxygen Saturation is 98% on RA, normal by my interpretation.    COORDINATION OF CARE:  12:27 PM Discussed treatment plan with pt at bedside and pt agreed to plan.   Labs Review Labs Reviewed - No data to display  Imaging Review Dg Shoulder Right  12/22/2014   CLINICAL DATA:  Pain from right side of neck, under ear that radiates down to posterior humerus near armpit area. Pt was at work and picked up a heavy box and felt a "pop" in his right shoulder  EXAM: RIGHT SHOULDER - 2+ VIEW  COMPARISON:  None.  FINDINGS: Glenohumeral joint is intact. No evidence of scapular fracture or humeral fracture. The acromioclavicular joint is intact.  IMPRESSION: No fracture or dislocation.   Electronically Signed   By: Suzy Bouchard M.D.   On: 12/22/2014 13:50   I have personally reviewed and evaluated these images and lab results as part of my medical decision-making.   EKG Interpretation None      MDM   Final diagnoses:  Shoulder strain, right, initial encounter    Pt is neurologically intact. No acute injury noted. Pt is okay to follow up with pcp    I personally performed the services described in this documentation, which was scribed in my presence. The recorded information has been reviewed and is accurate.   Glendell Docker, NP 12/22/14 Daisy, MD 12/27/14 [phone]

## 2014-12-22 NOTE — ED Notes (Signed)
Pt reports right shoulder x 4 months. Reports lifted a box of plates weighting about 20 lbs last Sunday, pain got worse , also reports numbness right arm and right neck radiating to hand.

## 2015-01-05 ENCOUNTER — Ambulatory Visit: Payer: Self-pay | Admitting: Internal Medicine

## 2015-02-02 ENCOUNTER — Encounter: Payer: Self-pay | Admitting: Internal Medicine

## 2015-02-02 ENCOUNTER — Ambulatory Visit: Payer: Self-pay | Attending: Internal Medicine | Admitting: Internal Medicine

## 2015-02-02 VITALS — BP 107/72 | HR 69 | Temp 98.0°F | Resp 18 | Ht 71.0 in | Wt 236.2 lb

## 2015-02-02 DIAGNOSIS — Z79899 Other long term (current) drug therapy: Secondary | ICD-10-CM | POA: Insufficient documentation

## 2015-02-02 DIAGNOSIS — K219 Gastro-esophageal reflux disease without esophagitis: Secondary | ICD-10-CM | POA: Insufficient documentation

## 2015-02-02 DIAGNOSIS — N529 Male erectile dysfunction, unspecified: Secondary | ICD-10-CM | POA: Insufficient documentation

## 2015-02-02 DIAGNOSIS — R062 Wheezing: Secondary | ICD-10-CM | POA: Insufficient documentation

## 2015-02-02 DIAGNOSIS — N528 Other male erectile dysfunction: Secondary | ICD-10-CM

## 2015-02-02 DIAGNOSIS — M4692 Unspecified inflammatory spondylopathy, cervical region: Secondary | ICD-10-CM

## 2015-02-02 DIAGNOSIS — M25511 Pain in right shoulder: Secondary | ICD-10-CM | POA: Insufficient documentation

## 2015-02-02 DIAGNOSIS — M47812 Spondylosis without myelopathy or radiculopathy, cervical region: Secondary | ICD-10-CM | POA: Insufficient documentation

## 2015-02-02 MED ORDER — IBUPROFEN 800 MG PO TABS: 800.0000 mg | ORAL_TABLET | Freq: Three times a day (TID) | ORAL | Status: DC | PRN

## 2015-02-02 MED ORDER — BECLOMETHASONE DIPROPIONATE 80 MCG/ACT IN AERS: 2.0000 | INHALATION_SPRAY | Freq: Every day | RESPIRATORY_TRACT | Status: DC

## 2015-02-02 MED ORDER — SILDENAFIL CITRATE 100 MG PO TABS: 50.0000 mg | ORAL_TABLET | Freq: Every day | ORAL | Status: DC | PRN

## 2015-02-02 MED ORDER — BENZONATATE 100 MG PO CAPS: 100.0000 mg | ORAL_CAPSULE | Freq: Three times a day (TID) | ORAL | Status: DC | PRN

## 2015-02-02 MED ORDER — CYCLOBENZAPRINE HCL 5 MG PO TABS: 5.0000 mg | ORAL_TABLET | Freq: Three times a day (TID) | ORAL | Status: DC | PRN

## 2015-02-02 NOTE — Progress Notes (Signed)
Patient complains of pain in neck and extremeites desribed as numbing and tingling, pain scaled at a 1. v

## 2015-02-02 NOTE — Progress Notes (Signed)
Patient here for neck and extremity pain. Patient states hands and fingers are numb and tingle in the morning.  Patient needs refills on Viagra, Omeprazole and Benzonate.

## 2015-02-02 NOTE — Progress Notes (Signed)
Patient ID: Keith Bruce, male   DOB: [birth year], 64 y.o.   MRN: [id number]   Keith Bruce, is a 64 y.o. male  HQI:[id number]  XLK:[id number]  DOB - [birth year]  Chief Complaint  Patient presents with  . Neck Pain        Subjective:   Keith Bruce is a 64 y.o. male here today for a follow up visit. Patient has history of GERD and erectile dysfunction. He needs medication refills. He has no significant complaints today except for ongoing right shoulder pain despite trying different pain medications. Occasionally he has restricted movement of the shoulder joints, recent x-ray was normal. He said his GERD is under control, he has changed his diet and that this working out fine for him. He does not smoke cigarettes, does not drink alcohol. Patient has No headache, No chest pain, No abdominal pain - No Nausea, No new weakness tingling or numbness, No Cough - SOB.  No problems updated.  ALLERGIES: No Known Allergies  PAST MEDICAL HISTORY: Past Medical History  Diagnosis Date  . Arthritis   . GERD (gastroesophageal reflux disease)   . Personal history of colonic adenoma 12/29/2012  . Dyspnea   . Cough     MEDICATIONS AT HOME: Prior to Admission medications   Medication Sig Start Date End Date Taking? Authorizing Provider  beclomethasone (QVAR) 80 MCG/ACT inhaler Inhale 2 puffs into the lungs daily. 02/02/15  Yes Tresa Garter, MD  cyclobenzaprine (FLEXERIL) 5 MG tablet Take 1 tablet (5 mg total) by mouth 3 (three) times daily as needed for muscle spasms. 02/02/15  Yes Tresa Garter, MD  gabapentin (NEURONTIN) 100 MG capsule Take 1 capsule (100 mg total) by mouth 3 (three) times daily. 07/28/14  Yes Tresa Garter, MD  ibuprofen (ADVIL,MOTRIN) 800 MG tablet Take 1 tablet (800 mg total) by mouth every 8 (eight) hours as needed for mild pain. 02/02/15  Yes Tresa Garter, MD  omeprazole (PRILOSEC) 40 MG capsule Take 1 capsule (40 mg total) by mouth daily. 09/23/13  Yes  Tresa Garter, MD  benzonatate (TESSALON) 100 MG capsule Take 1 capsule (100 mg total) by mouth 3 (three) times daily as needed for cough. 02/02/15   Tresa Garter, MD  sildenafil (VIAGRA) 100 MG tablet Take 0.5-1 tablets (50-100 mg total) by mouth daily as needed for erectile dysfunction. 02/02/15   Tresa Garter, MD  sucralfate (CARAFATE) 1 G tablet Take 1 tablet (1 g total) by mouth 4 (four) times daily -  before meals and at bedtime. Patient not taking: Reported on 02/02/2015 03/04/13   Tresa Garter, MD     Objective:   Filed Vitals:   02/02/15 1406  BP: 107/72  Pulse: 69  Temp: 98 F (36.7 C)  TempSrc: Oral  Resp: 18  Height: 5\' 11"  (1.803 m)  Weight: 236 lb 3.2 oz (107.14 kg)  SpO2: 96%    Exam General appearance : Awake, alert, not in any distress. Speech Clear. Not toxic looking HEENT: Atraumatic and Normocephalic, pupils equally reactive to light and accomodation Neck: supple, no JVD. No cervical lymphadenopathy.  Chest:Good air entry bilaterally, no added sounds  CVS: S1 S2 regular, no murmurs.  Abdomen: Bowel sounds present, Non tender and not distended with no gaurding, rigidity or rebound. Extremities: B/L Lower Ext shows no edema, both legs are warm to touch Neurology: Awake alert, and oriented X 3, CN II-XII intact, Non focal Skin:No Rash  Data Review Lab Results  Component Value  Date   HGBA1C 5.40 07/28/2014   HGBA1C 5.3 10/07/2012     Assessment & Plan   1. Gastroesophageal reflux disease without esophagitis Continue omeprazole 20 mg tablet by mouth daily.  2. 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 Other male erectile dysfunction  - sildenafil (VIAGRA) 100 MG tablet; Take 0.5-1 tablets (50-100 mg total) by mouth daily as needed for erectile dysfunction.  Dispense: 30 tablet; Refill: 3  4. Right shoulder pain  - ibuprofen (ADVIL,MOTRIN) 800 MG  tablet; Take 1 tablet (800 mg total) by mouth every 8 (eight) hours as needed for mild pain.  Dispense: 60 tablet; Refill: 1 - cyclobenzaprine (FLEXERIL) 5 MG tablet; Take 1 tablet (5 mg total) by mouth 3 (three) times daily as needed for muscle spasms.  Dispense: 30 tablet; Refill: 1 - Ambulatory referral to Sports Medicine for possible intra-articular steroid injection.  5. Wheezing  - beclomethasone (QVAR) 80 MCG/ACT inhaler; Inhale 2 puffs into the lungs daily.  Dispense: 3 Inhaler; Refill: 12  Patient have been counseled extensively about nutrition and exercise  Return in about 6 months (around 08/02/2015), or if symptoms worsen or fail to improve, for Follow up Pain and comorbidities, Annual Physical.  The patient was given clear instructions to go to ER or return to medical center if symptoms don't improve, worsen or new problems develop. The patient verbalized understanding. The patient was told to call to get lab results if they haven't heard anything in the next week.   This note has been created with Surveyor, quantity. Any transcriptional errors are unintentional.    Angelica Chessman, MD, [place], [place], [place], [place] and [place], [place]   02/02/2015, 2:44 PM

## 2015-02-02 NOTE — Patient Instructions (Signed)
Food Choices for Gastroesophageal Reflux Disease, Adult When you have gastroesophageal reflux disease (GERD), the foods you eat and your eating habits are very important. Choosing the right foods can help ease the discomfort of GERD. WHAT GENERAL GUIDELINES DO I NEED TO FOLLOW?  Choose fruits, vegetables, whole grains, low-fat dairy products, and low-fat meat, fish, and poultry.  Limit fats such as oils, salad dressings, butter, nuts, and avocado.  Keep a food diary to identify foods that cause symptoms.  Avoid foods that cause reflux. These may be different for different people.  Eat frequent small meals instead of three large meals each day.  Eat your meals slowly, in a relaxed setting.  Limit fried foods.  Cook foods using methods other than frying.  Avoid drinking alcohol.  Avoid drinking large amounts of liquids with your meals.  Avoid bending over or lying down until 2-3 hours after eating. WHAT FOODS ARE NOT RECOMMENDED? The following are some foods and drinks that may worsen your symptoms: Vegetables Tomatoes. Tomato juice. Tomato and spaghetti sauce. Chili peppers. Onion and garlic. Horseradish. Fruits Oranges, grapefruit, and lemon (fruit and juice). Meats High-fat meats, fish, and poultry. This includes hot dogs, ribs, ham, sausage, salami, and bacon. Dairy Whole milk and chocolate milk. Sour cream. Cream. Butter. Ice cream. Cream cheese.  Beverages Coffee and tea, with or without caffeine. Carbonated beverages or energy drinks. Condiments Hot sauce. Barbecue sauce.  Sweets/Desserts Chocolate and cocoa. Donuts. Peppermint and spearmint. Fats and Oils High-fat foods, including [place] fries and potato chips. Other Vinegar. Strong spices, such as black pepper, white pepper, red pepper, cayenne, curry powder, cloves, ginger, and chili powder. The items listed above may not be a complete list of foods and beverages to avoid. Contact your dietitian for more  information.   This information is not intended to replace advice given to you by your health care provider. Make sure you discuss any questions you have with your health care provider.   Document Released: 03/18/2005 Document Revised: 04/08/2014 Document Reviewed: 01/20/2013 Elsevier Interactive Patient Education 2016 Elsevier Inc[employer] Bronchospasm, Adult A bronchospasm is when the tubes that carry air in and out of your lungs (airways) spasm or tighten. During a bronchospasm it is hard to breathe. This is because the airways get smaller. A bronchospasm can be triggered by:  Allergies. These may be to animals, pollen, food, or mold.  Infection. This is a common cause of bronchospasm.  Exercise.  Irritants. These include pollution, cigarette smoke, strong odors, aerosol sprays, and paint fumes.  Weather changes.  Stress.  Being emotional. HOME CARE   Always have a plan for getting help. Know when to call your doctor and local emergency services (911 in the [place] Know where you can get emergency care.  Only take medicines as told by your doctor.  If you were prescribed an inhaler or nebulizer machine, ask your doctor how to use it correctly. Always use a spacer with your inhaler if you were given one.  Stay calm during an attack. Try to relax and breathe more slowly.  Control your home environment:  Change your heating and air conditioning filter at least once a month.  Limit your use of fireplaces and wood stoves.  Do not  smoke. Do not  allow smoking in your home.  Avoid perfumes and fragrances.  Get rid of pests (such as roaches and mice) and their droppings.  Throw away plants if you see mold on them.  Keep your house clean and dust free.  Replace carpet with wood, tile, or vinyl flooring. Carpet can trap dander and dust.  Use allergy-proof pillows, mattress covers, and box spring covers.  Wash bed sheets and blankets every week in hot water. Dry them in a  dryer.  Use blankets that are made of polyester or cotton.  Wash hands frequently. GET HELP IF:  You have muscle aches.  You have chest pain.  The thick spit you spit or cough up (sputum) changes from clear or white to yellow, green, gray, or bloody.  The thick spit you spit or cough up gets thicker.  There are problems that may be related to the medicine you are given such as:  A rash.  Itching.  Swelling.  Trouble breathing. GET HELP RIGHT AWAY IF:  You feel you cannot breathe or catch your breath.  You cannot stop coughing.  Your treatment is not helping you breathe better.  You have very bad chest pain. MAKE SURE YOU:   Understand these instructions.  Will watch your condition.  Will get help right away if you are not doing well or get worse.   This information is not intended to replace advice given to you by your health care provider. Make sure you discuss any questions you have with your health care provider.   Document Released: 01/13/2009 Document Revised: 04/08/2014 Document Reviewed: 09/08/2012 Elsevier Interactive Patient Education [employer].

## 2015-02-04 ENCOUNTER — Emergency Department (HOSPITAL_COMMUNITY)
Admission: EM | Admit: 2015-02-04 | Discharge: 2015-02-04 | Disposition: A | Discharge status: Home / Self Care | Payer: Self-pay | Attending: Emergency Medicine | Admitting: Emergency Medicine

## 2015-02-04 ENCOUNTER — Encounter (HOSPITAL_COMMUNITY): Payer: Self-pay

## 2015-02-04 ENCOUNTER — Emergency Department (HOSPITAL_COMMUNITY): Payer: Self-pay

## 2015-02-04 DIAGNOSIS — Z79899 Other long term (current) drug therapy: Secondary | ICD-10-CM | POA: Insufficient documentation

## 2015-02-04 DIAGNOSIS — K219 Gastro-esophageal reflux disease without esophagitis: Secondary | ICD-10-CM | POA: Insufficient documentation

## 2015-02-04 DIAGNOSIS — Z7951 Long term (current) use of inhaled steroids: Secondary | ICD-10-CM | POA: Insufficient documentation

## 2015-02-04 DIAGNOSIS — Z86010 Personal history of colonic polyps: Secondary | ICD-10-CM | POA: Insufficient documentation

## 2015-02-04 DIAGNOSIS — J069 Acute upper respiratory infection, unspecified: Secondary | ICD-10-CM | POA: Insufficient documentation

## 2015-02-04 DIAGNOSIS — Z8739 Personal history of other diseases of the musculoskeletal system and connective tissue: Secondary | ICD-10-CM | POA: Insufficient documentation

## 2015-02-04 LAB — BASIC METABOLIC PANEL
ANION GAP: 8 (ref 5–15)
BUN: 13 mg/dL (ref 6–20)
CALCIUM: 9 mg/dL (ref 8.9–10.3)
CHLORIDE: 103 mmol/L (ref 101–111)
CO2: 28 mmol/L (ref 22–32)
Creatinine, Ser: 0.97 mg/dL (ref 0.61–1.24)
GFR calc Af Amer: >60 mL/min (ref >60)
GFR calc non Af Amer: >60 mL/min (ref >60)
GLUCOSE: 101 mg/dL — AB (ref 65–99)
Potassium: 4.5 mmol/L (ref 3.5–5.1)
Sodium: 139 mmol/L (ref 135–145)

## 2015-02-04 LAB — CBC WITH DIFFERENTIAL/PLATELET
BASOS PCT: 0 %
Basophils Absolute: 0 10*3/uL (ref 0.0–0.1)
EOS PCT: 3 %
Eosinophils Absolute: 0.3 10*3/uL (ref 0.0–0.7)
HEMATOCRIT: 43.2 % (ref 39.0–52.0)
HEMOGLOBIN: 15 g/dL (ref 13.0–17.0)
LYMPHS ABS: 2.4 10*3/uL (ref 0.7–4.0)
LYMPHS PCT: 21 %
MCH: 31.2 pg (ref 26.0–34.0)
MCHC: 34.7 g/dL (ref 30.0–36.0)
MCV: 89.8 fL (ref 78.0–100.0)
MONOS PCT: 9 %
Monocytes Absolute: 1 10*3/uL (ref 0.1–1.0)
NEUTROS ABS: 7.5 10*3/uL (ref 1.7–7.7)
NEUTROS PCT: 67 %
Platelets: ADEQUATE 10*3/uL (ref 150–400)
RBC: 4.81 MIL/uL (ref 4.22–5.81)
RDW: 12 % (ref 11.5–15.5)
WBC: 11.2 10*3/uL — ABNORMAL HIGH (ref 4.0–10.5)

## 2015-02-04 MED ORDER — BENZONATATE 100 MG PO CAPS: 100.0000 mg | ORAL_CAPSULE | Freq: Three times a day (TID) | ORAL | Status: DC

## 2015-02-04 NOTE — ED Provider Notes (Signed)
History  By signing my name below, I, Marlowe Kays, attest that this documentation has been prepared under the direction and in the presence of Bernerd Limbo, [place]. Electronically Signed: Marlowe Kays, ED Scribe. 02/04/2015. 3:46 PM.  Chief Complaint  Patient presents with  . URI   The history is provided by the patient and medical records. No language interpreter was used.     HPI Comments:  Keith Bruce is a 64 y.o. male who presents to the Emergency Department with nasal congestion, cough productive of yellow sputum, and diarrhea x 5 days. He reports associated intermittent lightheadedness. He states his diarrhea has improved since onset. He denies abdominal pain, N/V, hematochezia, melena.  He denies exacerbating factors.  He has tried robitussin for symptom relief, which has been minimally effective. He denies fever, chills, headache, chest pain, shortness of breath.   Past Medical History  Diagnosis Date  . Arthritis   . GERD (gastroesophageal reflux disease)   . Personal history of colonic adenoma 12/29/2012  . Dyspnea   . Cough    Past Surgical History  Procedure Laterality Date  . Back surgery      Age [age]   Family History  Problem Relation Age of Onset  . Heart disease Mother   . Kidney disease Father   . Asthma      Half sister   Social History  Substance Use Topics  . Smoking status: Never Smoker   . Smokeless tobacco: Never Used  . Alcohol Use: 1.8 - 2.4 oz/week    3-4 Cans of beer per week     Comment: 3-4 beers a week but hasnt had any in last 2 months.       Review of Systems  Constitutional: Negative for fever and chills.  HENT: Positive for congestion. Negative for ear discharge and ear pain.   Respiratory: Positive for cough. Negative for shortness of breath.   Cardiovascular: Negative for chest pain.  Gastrointestinal: Positive for diarrhea. Negative for nausea, vomiting, abdominal pain and constipation.  Musculoskeletal:  Negative for myalgias and arthralgias.  Neurological: Positive for light-headedness. Negative for headaches.  All other systems reviewed and are negative.   Allergies  Review of patient's allergies indicates no known allergies.  Home Medications   Prior to Admission medications   Medication Sig Start Date End Date Taking? Authorizing Provider  beclomethasone (QVAR) 80 MCG/ACT inhaler Inhale 2 puffs into the lungs daily. 02/02/15   Tresa Garter, MD  benzonatate (TESSALON) 100 MG capsule Take 1 capsule (100 mg total) by mouth every 8 (eight) hours. 02/04/15   Marella Chimes, PA-C  cyclobenzaprine (FLEXERIL) 5 MG tablet Take 1 tablet (5 mg total) by mouth 3 (three) times daily as needed for muscle spasms. 02/02/15   Tresa Garter, MD  gabapentin (NEURONTIN) 100 MG capsule Take 1 capsule (100 mg total) by mouth 3 (three) times daily. 07/28/14   Tresa Garter, MD  ibuprofen (ADVIL,MOTRIN) 800 MG tablet Take 1 tablet (800 mg total) by mouth every 8 (eight) hours as needed for mild pain. 02/02/15   Tresa Garter, MD  omeprazole (PRILOSEC) 40 MG capsule Take 1 capsule (40 mg total) by mouth daily. 09/23/13   Tresa Garter, MD  sildenafil (VIAGRA) 100 MG tablet Take 0.5-1 tablets (50-100 mg total) by mouth daily as needed for erectile dysfunction. 02/02/15   Tresa Garter, MD  sucralfate (CARAFATE) 1 G tablet Take 1 tablet (1 g total) by mouth 4 (four) times daily -  before meals and at bedtime. Patient not taking: Reported on 02/02/2015 03/04/13   Tresa Garter, MD    Triage Vitals: BP 114/74 mmHg  Pulse 58  Temp(Src) 98.1 F (36.7 C) (Oral)  Resp 18  SpO2 98% Physical Exam  Constitutional: He is oriented to person, place, and time. He appears well-developed and well-nourished. No distress.  HENT:  Head: Normocephalic and atraumatic.  Right Ear: External ear normal.  Left Ear: External ear normal.  Nose: Nose normal.  Mouth/Throat: Uvula is midline,  oropharynx is clear and moist and mucous membranes are normal.  Eyes: Conjunctivae, EOM and lids are normal. Pupils are equal, round, and reactive to light. Right eye exhibits no discharge. Left eye exhibits no discharge. No scleral icterus.  Neck: Normal range of motion. Neck supple.  Cardiovascular: Normal rate, regular rhythm, normal heart sounds, intact distal pulses and normal pulses.   Pulmonary/Chest: Effort normal and breath sounds normal. No respiratory distress. He has no wheezes. He has no rales.  Abdominal: Soft. Normal appearance and bowel sounds are normal. He exhibits no distension and no mass. There is no tenderness. There is no rigidity, no rebound and no guarding.  Musculoskeletal: Normal range of motion. He exhibits no edema or tenderness.  Neurological: He is alert and oriented to person, place, and time. He has normal strength. No sensory deficit.  Skin: Skin is warm, dry and intact. No rash noted. He is not diaphoretic. No erythema. No pallor.  Psychiatric: He has a normal mood and affect. His speech is normal and behavior is normal.  Nursing note and vitals reviewed.   ED Course  Procedures (including critical care time)  DIAGNOSTIC STUDIES: Oxygen Saturation is 98% on RA, normal by my interpretation.   COORDINATION OF CARE: 2:43 PM- Will order CXR and lab work. Pt verbalizes understanding and agrees to plan.  Medications - No data to display  Labs Review Labs Reviewed  CBC WITH DIFFERENTIAL/PLATELET - Abnormal; Notable for the following:    WBC 11.2 (*)    All other components within normal limits  BASIC METABOLIC PANEL - Abnormal; Notable for the following:    Glucose, Bld 101 (*)    All other components within normal limits    Imaging Review Dg Chest 2 View  02/04/2015  CLINICAL DATA:  Productive cough for 3 days. EXAM: CHEST  2 VIEW COMPARISON:  05/07/2013; 03/05/2013 FINDINGS: Grossly unchanged borderline enlarged cardiac silhouette. Normal mediastinal  contours. Left basilar/retrocardiac linear heterogeneous opacities are unchanged and favored to represent atelectasis or scar. No new focal airspace opacities. No pleural effusion or pneumothorax. No evidence of edema. No acute osseus abnormalities. IMPRESSION: Similar findings of left basilar atelectasis/scar without acute cardiopulmonary disease. Electronically Signed   By: Sandi Mariscal M.D.   On: 02/04/2015 15:12   I have personally reviewed and evaluated these images and lab results as part of my medical decision-making.   EKG Interpretation None      MDM   Final diagnoses:  URI (upper respiratory infection)    64 year old male presents with nasal congestion, cough, and diarrhea x 5 days. Denies abdominal pain, N/V, hematochezia, melena, fever, chills, headache, chest pain, shortness of breath. Patient is afebrile. Vital signs stable. Posterior oropharynx without edema, erythema, or exudate. No nasal congestion. TMs clear. Heart RRR. Lungs clear to auscultation bilaterally. Abdomen soft, non-tender, non-distended.  CBC remarkable for mild leukocytosis of 11.2, BMP within normal limits. CXR negative for acute cardiopulmonary disease.  Patient is well-appearing and  non-toxic. Symptoms most likely viral. Will treat with cough medicine. Return precautions discussed. Patient to follow-up with PCP. Patient verbalizes his understanding and is in agreement with plan.  BP 114/74 mmHg  Pulse 58  Temp(Src) 98.1 F (36.7 C) (Oral)  Resp 18  SpO2 98%  I personally performed the services described in this documentation, which was scribed in my presence. The recorded information has been reviewed and is accurate.       Marella Chimes, PA-C 02/04/15 [place], MD 02/05/15 [phone]

## 2015-02-04 NOTE — ED Notes (Signed)
He c/o cough/congestion and occasional diarrhea since this Mon.  He states the diarrhea is greatly improved; however, cough and congestion persist.

## 2015-02-04 NOTE — Discharge Instructions (Signed)
1. Medications: tessalon, usual home medications 2. Treatment: rest, drink plenty of fluids 3. Follow Up: please followup with your primary doctor for discussion of your diagnoses and further evaluation after today's visit; if you do not have a primary care doctor use the resource guide provided to find one; please return to the ER for highi fever, chest pain, shortness of breath, new or worsening symptoms    Cough, Adult Coughing is a reflex that clears your throat and your airways. Coughing helps to heal and protect your lungs. It is normal to cough occasionally, but a cough that happens with other symptoms or lasts a long time may be a sign of a condition that needs treatment. A cough may last only 2-3 weeks (acute), or it may last longer than 8 weeks (chronic). CAUSES Coughing is commonly caused by:  Breathing in substances that irritate your lungs.  A viral or bacterial respiratory infection.  Allergies.  Asthma.  Postnasal drip.  Smoking.  Acid backing up from the stomach into the esophagus (gastroesophageal reflux).  Certain medicines.  Chronic lung problems, including COPD (or rarely, lung cancer).  Other medical conditions such as heart failure. HOME CARE INSTRUCTIONS  Pay attention to any changes in your symptoms. Take these actions to help with your discomfort:  Take medicines only as told by your health care provider.  If you were prescribed an antibiotic medicine, take it as told by your health care provider. Do not stop taking the antibiotic even if you start to feel better.  Talk with your health care provider before you take a cough suppressant medicine.  Drink enough fluid to keep your urine clear or pale yellow.  If the air is dry, use a cold steam vaporizer or humidifier in your bedroom or your home to help loosen secretions.  Avoid anything that causes you to cough at work or at home.  If your cough is worse at night, try sleeping in a semi-upright  position.  Avoid cigarette smoke. If you smoke, quit smoking. If you need help quitting, ask your health care provider.  Avoid caffeine.  Avoid alcohol.  Rest as needed. SEEK MEDICAL CARE IF:   You have new symptoms.  You cough up pus.  Your cough does not get better after 2-3 weeks, or your cough gets worse.  You cannot control your cough with suppressant medicines and you are losing sleep.  You develop pain that is getting worse or pain that is not controlled with pain medicines.  You have a fever.  You have unexplained weight loss.  You have night sweats. SEEK IMMEDIATE MEDICAL CARE IF:  You cough up blood.  You have difficulty breathing.  Your heartbeat is very fast.   This information is not intended to replace advice given to you by your health care provider. Make sure you discuss any questions you have with your health care provider.   Document Released: 09/14/2010 Document Revised: 12/07/2014 Document Reviewed: 05/25/2014 Elsevier Interactive Patient Education 2016 Elsevier Inc.  Upper Respiratory Infection, Adult Most upper respiratory infections (URIs) are a viral infection of the air passages leading to the lungs. A URI affects the nose, throat, and upper air passages. The most common type of URI is nasopharyngitis and is typically referred to as "the common cold." URIs run their course and usually go away on their own. Most of the time, a URI does not require medical attention, but sometimes a bacterial infection in the upper airways can follow a viral infection. This  is called a secondary infection. Sinus and middle ear infections are common types of secondary upper respiratory infections. Bacterial pneumonia can also complicate a URI. A URI can worsen asthma and chronic obstructive pulmonary disease (COPD). Sometimes, these complications can require emergency medical care and may be life threatening.  CAUSES Almost all URIs are caused by viruses. A virus is  a type of germ and can spread from one person to another.  RISKS FACTORS You may be at risk for a URI if:   You smoke.   You have chronic heart or lung disease.  You have a weakened defense (immune) system.   You are very young or very old.   You have nasal allergies or asthma.  You work in crowded or poorly ventilated areas.  You work in health care facilities or schools. SIGNS AND SYMPTOMS  Symptoms typically develop 2-3 days after you come in contact with a cold virus. Most viral URIs last 7-10 days. However, viral URIs from the influenza virus (flu virus) can last 14-18 days and are typically more severe. Symptoms may include:   Runny or stuffy (congested) nose.   Sneezing.   Cough.   Sore throat.   Headache.   Fatigue.   Fever.   Loss of appetite.   Pain in your forehead, behind your eyes, and over your cheekbones (sinus pain).  Muscle aches.  DIAGNOSIS  Your health care provider may diagnose a URI by:  Physical exam.  Tests to check that your symptoms are not due to another condition such as:  Strep throat.  Sinusitis.  Pneumonia.  Asthma. TREATMENT  A URI goes away on its own with time. It cannot be cured with medicines, but medicines may be prescribed or recommended to relieve symptoms. Medicines may help:  Reduce your fever.  Reduce your cough.  Relieve nasal congestion. HOME CARE INSTRUCTIONS   Take medicines only as directed by your health care provider.   Gargle warm saltwater or take cough drops to comfort your throat as directed by your health care provider.  Use a warm mist humidifier or inhale steam from a shower to increase air moisture. This may make it easier to breathe.  Drink enough fluid to keep your urine clear or pale yellow.   Eat soups and other clear broths and maintain good nutrition.   Rest as needed.   Return to work when your temperature has returned to normal or as your health care provider  advises. You may need to stay home longer to avoid infecting others. You can also use a face mask and careful hand washing to prevent spread of the virus.  Increase the usage of your inhaler if you have asthma.   Do not use any tobacco products, including cigarettes, chewing tobacco, or electronic cigarettes. If you need help quitting, ask your health care provider. PREVENTION  The best way to protect yourself from getting a cold is to practice good hygiene.   Avoid oral or hand contact with people with cold symptoms.   Wash your hands often if contact occurs.  There is no clear evidence that vitamin C, vitamin E, echinacea, or exercise reduces the chance of developing a cold. However, it is always recommended to get plenty of rest, exercise, and practice good nutrition.  SEEK MEDICAL CARE IF:   You are getting worse rather than better.   Your symptoms are not controlled by medicine.   You have chills.  You have worsening shortness of breath.  You have  brown or red mucus.  You have yellow or brown nasal discharge.  You have pain in your face, especially when you bend forward.  You have a fever.  You have swollen neck glands.  You have pain while swallowing.  You have white areas in the back of your throat. SEEK IMMEDIATE MEDICAL CARE IF:   You have severe or persistent:  Headache.  Ear pain.  Sinus pain.  Chest pain.  You have chronic lung disease and any of the following:  Wheezing.  Prolonged cough.  Coughing up blood.  A change in your usual mucus.  You have a stiff neck.  You have changes in your:  Vision.  Hearing.  Thinking.  Mood. MAKE SURE YOU:   Understand these instructions.  Will watch your condition.  Will get help right away if you are not doing well or get worse.   This information is not intended to replace advice given to you by your health care provider. Make sure you discuss any questions you have with your health care  provider.   Document Released: 09/11/2000 Document Revised: 08/02/2014 Document Reviewed: 06/23/2013 Elsevier Interactive Patient Education 2016 [employer].   Emergency Department Resource Guide 1) Find a Doctor and Pay Out of Pocket Although you won't have to find out who is covered by your insurance plan, it is a good idea to ask around and get recommendations. You will then need to call the office and see if the doctor you have chosen will accept you as a new patient and what types of options they offer for patients who are self-pay. Some doctors offer discounts or will set up payment plans for their patients who do not have insurance, but you will need to ask so you aren't surprised when you get to your appointment.  2) Contact Your Local Health Department Not all health departments have doctors that can see patients for sick visits, but many do, so it is worth a call to see if yours does. If you don't know where your local health department is, you can check in your phone book. The CDC also has a tool to help you locate your state's health department, and many state websites also have listings of all of their local health departments.  3) Find a [facility] If your illness is not likely to be very severe or complicated, you may want to try a walk in clinic. These are popping up all over the country in pharmacies, drugstores, and shopping centers. They're usually staffed by nurse practitioners or physician assistants that have been trained to treat common illnesses and complaints. They're usually fairly quick and inexpensive. However, if you have serious medical issues or chronic medical problems, these are probably not your best option.  No Primary Care Doctor: - Call Health Connect at  [phone] - they can help you locate a primary care doctor that  accepts your insurance, provides certain services, etc. - Physician Referral Service- [phone]  Chronic Pain  Problems: Organization         Address  Phone   Notes  [facility]  [phone] Patients need to be referred by their primary care doctor.   Medication Assistance: Organization         Address  Phone   Notes  [facility] Medication [facility] [address] [place], [place] [address] [phone] --Must be a resident of [facility] -- Must have NO insurance coverage whatsoever (no Medicaid/ Medicare,  etc.) -- The pt. MUST have a primary care doctor that directs their care regularly and follows them in the community   [facility]ist  [phone]   [employer]  [phone]    Agencies that provide inexpensive medical care: Organization         Address  Phone   Notes  [place]  [phone]   [facility]    [phone]   [facility] [place], [place] [address] [phone]   [place] [address], [place] [phone]   Planned Parenthood    [phone]   [facility]    [phone]   [place] and [address], [place] Phone:  [phone], Fax:  [phone] Hours of Operation:  9 am - 6 pm, M-F.  Also accepts Medicaid/Medicare and self-pay.  [facility] for [place], [address], [place] Phone: [phone], Fax: [phone]. Hours of Operation:  8:30 am - 5:30 pm, M-F.  Also accepts Medicaid and self-pay.  [facility] [place] [address], [place] Phone: [phone]   [place], [place], [place] [phone], Ext. [phone] Mondays & Thursdays: 7-9 AM.  First 15 patients are seen on a first come, first serve basis.    [place] Providers:  Organization         Address  Phone   Notes  [facility] [address], [place] [phone] Also  accepts self-pay patients.  [facility] [address], [place]  [phone]   [place], [address], [place] [phone]   [facility] [address] [place] [phone]   Lucianne Lei [address], [place]   [phone] Only accepts [place] Access [place] patients after they have their name applied to their card.   Self-Pay (no insurance) in [facility]:  Organization         Address  Phone   Notes  Sickle Cell Patients, [facility] Internal Medicine [place] [phone]   [facility] [place] [phone]   [facility] [place]  [place], [place], [place] [phone]   [facility]/Dr. Osei-Bonsu  [address] [place] or [address], [place] [phone] Phone number for both [place] and [place] locations is the same.  [facility] [address], [place] [phone]   [facility] [address] [place] or [address] [phone]   [facility] [address] [place] [phone], phone; [phone], fax Sees patients 1st and 3rd Saturday of every month.  Must not qualify for public or private insurance (i.e. Medicaid, Medicare, [place] Health Choice, Veterans' Benefits)  Household income should be no more than 200% of the poverty level The clinic cannot treat you if you are pregnant or think you are pregnant  Sexually transmitted diseases are not treated at the clinic.    Dental Care: Organization         Address  Phone  Notes  [facility]  [address] [place], [place] [phone] Accepts children up to age 67 who are enrolled in [place] or [place]; pregnant  women with a Medicaid card; and children who have applied for Medicaid or [place] Health Choice, but were declined, whose parents can pay a reduced fee at time of service.  [facility]  [address], [place] [phone] Accepts children up to age 61 who are enrolled in [place] or [place]; pregnant women with a Medicaid card; and children who have applied for Medicaid or  Health Choice, but were declined, whose parents can pay a reduced fee at time of service.  [place] Adult Dental Access PROGRAM  [place] [phone] Patients are seen by appointment only. Walk-ins are not accepted. [place] will see patients 73 years of age and older. Monday - Tuesday (8am-5pm) Most Wednesdays (8:30-5pm) $30 per visit, cash only  [facility] Adult Dental Access PROGRAM  [address], [facility] [phone] Patients are seen by appointment only. Walk-ins are not accepted. [place] will see patients 13 years of age and older. One Wednesday Evening (Monthly: Volunteer Based).  $30 per visit, cash only  [place]  [phone] for adults; Children under age 69, call Graduate Pediatric Dentistry at [phone]. Children aged 31-14, please call [phone] to request a pediatric application.  Dental services are provided in all areas of dental care including fillings, crowns and bridges, complete and partial dentures, implants, gum treatment, root canals, and extractions. Preventive care is also provided. Treatment is provided to both adults and children. Patients are selected via a lottery and there is often a waiting list.   [facility] [address] [place]  [phone] [email or web address]   [facility] [address] [place], [place] [phone], Ext. [phone] Second and Fourth Thursday of each month, opens at 6:30 AM; Clinic ends at 9 AM.  Patients are  seen on a first-come first-served basis, and a limited number are seen during each clinic.   [facility]  [address] Hillard Danker [place], [place] [phone]   Eligibility Requirements You must have lived in [place], [place], or [place] counties for at least the last three months.   You cannot be eligible for state or federal sponsored [employer], including [employer], [place], or [employer].   You generally cannot be eligible for healthcare insurance through your employer.    How to apply: Eligibility screenings are held every Tuesday and Wednesday afternoon from 1:00 pm until 4:00 pm. You do not need an appointment for the interview!  [facility] [address] [place], [place]   [place]  [place] Department  [place]  [phone]    Behavioral Health Resources in the Community: Intensive Outpatient Programs Organization         Address  Phone  Notes  [place]. [address] [place], [place] [phone]   [facility] Outpatient [address], [place], [place]   ADS: Alcohol & Drug Svcs [address] [place], [place]   [place] [address],  [place], [place] or [phone]   Substance Abuse Resources Organization         Address  Phone  Notes  Alcohol and Drug Services  [place]  [phone]   [facility] [place]   Chinita Pester  [phone]   Residential & Outpatient Substance Abuse Program  [phone]   Psychological Services Organization         Address  Phone  Notes  [facility] [place]  [place]  [phone]   [place] [address] [place] or [phone]    Mobile Crisis  Teams Organization         Address  Phone  Notes  [facility], Mobile Crisis Care Unit  [phone]   Assertive Psychotherapeutic Services  [address] [place], [place]   [place] Levels [address] [place] [phone]    Self-Help/Support Groups Organization         Address  Phone             Notes  [place]. of [place] - variety of support groups  [place] Call for more information  Narcotics Anonymous (NA), [facility]aring [facility] [address], [employer] [place]  2 meetings at this location   Special educational needs teacher         Address  Phone  Notes  [facility] [place],    [place]  [phone]   [facility]  [address], [place] [address]883, [place], [place]   [place], [place] [phone] Admissions: 8am-3pm M-F  Incentives Substance [place] [address]    [place], [place] [phone]   [facility] [address] [place], [place], [place]   [facility] [address],  [place], [place]   [facility] Programs - Intensive Outpatient [address]een [address], [place], [address] [place]   [facility] ([place] [address] [address].,  [place], [place] [phone] or [phone]   [facility]idential [facility] Services (RTS) [address], [place], [place] Accepts Medicaid  [facility] [address]  [facility] [place] [phone] Substance Abuse/Addiction Treatment   [facility] Organization         Address  Phone  Notes  [facility]  [phone]   Domenic Schwab, PhD [address] [place], [place]   [phone] or [phone]   [place], [place] [phone]   [facility] [address], [place], [place] [phone]  Insurance/Medicaid/sponsorship through [facility] and Families [address], [address]                                    [place], [place] [phone] [place] [address], [place] [phone]    Dr. Adele Schilder  [phone]   [facility] Dept. 1) [address] [place] 2) [place] 3)  [place] 65, [place] [phone]  [phone]   [place] [phone] or [phone] (After Hours)

## 2015-02-06 ENCOUNTER — Ambulatory Visit (INDEPENDENT_AMBULATORY_CARE_PROVIDER_SITE_OTHER): Payer: Self-pay | Admitting: Family Medicine

## 2015-02-06 ENCOUNTER — Encounter: Payer: Self-pay | Admitting: Family Medicine

## 2015-02-06 ENCOUNTER — Encounter (INDEPENDENT_AMBULATORY_CARE_PROVIDER_SITE_OTHER): Payer: Self-pay

## 2015-02-06 VITALS — BP 123/77 | HR 52 | Ht 71.0 in | Wt 235.0 lb

## 2015-02-06 DIAGNOSIS — M5010 Cervical disc disorder with radiculopathy, unspecified cervical region: Secondary | ICD-10-CM

## 2015-02-06 MED ORDER — PREDNISONE 10 MG PO TABS: ORAL_TABLET | ORAL | Status: DC

## 2015-02-06 NOTE — Patient Instructions (Signed)
You have cervical radiculopathy (a pinched nerve in the neck). Prednisone 6 day dose pack to relieve irritation/inflammation of the nerve. Consider cervical collar if severely painful. Simple range of motion exercises within limits of pain to prevent further stiffness. Consider physical therapy for stretching, exercises, traction, and modalities - you have completed this already. Heat 15 minutes at a time 3-4 times a day to help with spasms. Watch head position when on computers, texting, when sleeping in bed - should in line with back to prevent further nerve traction and irritation. If not improving we will consider an MRI and/or injection. Call me in 1-2 weeks after finishing prednisone to let me know how you're doing.

## 2015-02-07 DIAGNOSIS — M5010 Cervical disc disorder with radiculopathy, unspecified cervical region: Secondary | ICD-10-CM | POA: Insufficient documentation

## 2015-02-07 NOTE — Progress Notes (Signed)
PCP: Angelica Chessman, MD  Subjective:   HPI: Patient is a 64 y.o. male here for right neck, shoulder pain.  Patient reports over 1 year of posterior right shoulder, neck pain. Pain severe, sharp with popping. Pain currently 3/10. Gets tingling into his right hand and fingers. Worse when lying down and at night. Rarely gets same symptoms into left arm. Tried tylenol, physical therapy for 9-12 visit. No skin changes, fever, other complaints.  Past Medical History  Diagnosis Date  . Arthritis   . GERD (gastroesophageal reflux disease)   . Personal history of colonic adenoma 12/29/2012  . Dyspnea   . Cough     Current Outpatient Prescriptions on File Prior to Visit  Medication Sig Dispense Refill  . beclomethasone (QVAR) 80 MCG/ACT inhaler Inhale 2 puffs into the lungs daily. 3 Inhaler 12  . benzonatate (TESSALON) 100 MG capsule Take 1 capsule (100 mg total) by mouth every 8 (eight) hours. 21 capsule 0  . cyclobenzaprine (FLEXERIL) 5 MG tablet Take 1 tablet (5 mg total) by mouth 3 (three) times daily as needed for muscle spasms. 30 tablet 1  . gabapentin (NEURONTIN) 100 MG capsule Take 1 capsule (100 mg total) by mouth 3 (three) times daily. 90 capsule 3  . ibuprofen (ADVIL,MOTRIN) 800 MG tablet Take 1 tablet (800 mg total) by mouth every 8 (eight) hours as needed for mild pain. 60 tablet 1  . omeprazole (PRILOSEC) 40 MG capsule Take 1 capsule (40 mg total) by mouth daily. 60 capsule 3  . sildenafil (VIAGRA) 100 MG tablet Take 0.5-1 tablets (50-100 mg total) by mouth daily as needed for erectile dysfunction. 30 tablet 3  . sucralfate (CARAFATE) 1 G tablet Take 1 tablet (1 g total) by mouth 4 (four) times daily -  before meals and at bedtime. (Patient not taking: Reported on 02/02/2015) 120 tablet 1   No current facility-administered medications on file prior to visit.    Past Surgical History  Procedure Laterality Date  . Back surgery      Age 37-35    No Known  Allergies  Social History   Social History  . Marital Status: Single    Spouse Name: N/A  . Number of Children: N/A  . Years of Education: N/A   Occupational History  . retired    Social History Main Topics  . Smoking status: Never Smoker   . Smokeless tobacco: Never Used  . Alcohol Use: 1.8 - 2.4 oz/week    3-4 Cans of beer per week     Comment: 3-4 beers a week but hasnt had any in last 2 months.   . Drug Use: No  . Sexual Activity: Not on file   Other Topics Concern  . Not on file   Social History Narrative    Family History  Problem Relation Age of Onset  . Heart disease Mother   . Kidney disease Father   . Asthma      Half sister    BP 123/77 mmHg  Pulse 52  Ht 5\' 11"  (1.803 m)  Wt 235 lb (106.595 kg)  BMI 32.79 kg/m2  Review of Systems: See HPI above.    Objective:  Physical Exam:  Gen: NAD  Neck: No gross deformity, swelling, bruising. TTP right paraspinal region, trapezius.  No midline/bony TTP. FROM neck - pain with flexion, lateral rotations. BUE strength 5/5.   Sensation intact to light touch.   1+ equal reflexes in triceps, biceps, brachioradialis tendons. Negative spurlings. NV intact  distal BUEs.  Right shoulder: No swelling, ecchymoses.  No gross deformity. No TTP. FROM. Negative Hawkins, Neers. Negative Yergasons. Strength 5/5 with empty can and resisted internal/external rotation. Negative apprehension. NV intact distally.  Left shoulder: FROM without pain.  Assessment & Plan:  1. Cervical radiculopathy - Known moderate foraminal stenosis at C6-7 level on the right.  Start with prednisone dose pack, heat for spasms.  Start range of motion exercises.  Has done physical therapy already.  Call us in 1-2 weeks.  If not improving would consider MRI or injection.

## 2015-02-07 NOTE — Assessment & Plan Note (Signed)
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  Start with prednisone dose pack, heat for spasms.  Start range of motion exercises.  Has done physical therapy already.  Call us in 1-2 weeks.  If not improving would consider MRI or injection.

## 2015-03-07 ENCOUNTER — Ambulatory Visit: Payer: Self-pay | Attending: Internal Medicine

## 2015-04-04 MED FILL — !QVAR 80 MCG ORAL INHALER: 80 MCG | 29 days supply | Qty: 1 | Fill #1

## 2015-04-07 ENCOUNTER — Other Ambulatory Visit: Payer: Self-pay

## 2015-04-07 DIAGNOSIS — R062 Wheezing: Secondary | ICD-10-CM

## 2015-04-07 DIAGNOSIS — N528 Other male erectile dysfunction: Secondary | ICD-10-CM

## 2015-04-07 MED ORDER — BECLOMETHASONE DIPROPIONATE 80 MCG/ACT IN AERS: 2.0000 | INHALATION_SPRAY | Freq: Every day | RESPIRATORY_TRACT | Status: DC

## 2015-04-07 MED ORDER — SILDENAFIL CITRATE 100 MG PO TABS: 50.0000 mg | ORAL_TABLET | Freq: Every day | ORAL | Status: DC | PRN

## 2015-04-19 ENCOUNTER — Other Ambulatory Visit: Payer: Self-pay | Admitting: Internal Medicine

## 2015-04-19 DIAGNOSIS — R062 Wheezing: Secondary | ICD-10-CM

## 2015-04-19 MED ORDER — BECLOMETHASONE DIPROPIONATE 80 MCG/ACT IN AERS: 2.0000 | INHALATION_SPRAY | Freq: Every day | RESPIRATORY_TRACT | Status: DC

## 2015-04-26 MED FILL — BENZONATATE 100 MG CAPSULE: 100 | 10 days supply | Qty: 30 | Fill #1

## 2015-04-26 MED FILL — IBUPROFEN 800 MG TABLET: 800 | 20 days supply | Qty: 60 | Fill #1

## 2015-07-20 ENCOUNTER — Ambulatory Visit: Payer: Self-pay | Admitting: Internal Medicine

## 2015-07-31 ENCOUNTER — Other Ambulatory Visit: Payer: Self-pay | Admitting: Internal Medicine

## 2015-07-31 MED FILL — IBUPROFEN 800 MG TABLET: 800 | 20 days supply | Qty: 60 | Fill #0

## 2015-07-31 MED FILL — CYCLOBENZAPRINE 5 MG TABLET: 5 | 10 days supply | Qty: 30 | Fill #1

## 2015-08-17 ENCOUNTER — Encounter: Payer: Self-pay | Admitting: Internal Medicine

## 2015-08-17 ENCOUNTER — Ambulatory Visit: Payer: Medicare HMO | Attending: Internal Medicine | Admitting: Internal Medicine

## 2015-08-17 VITALS — BP 116/73 | HR 66 | Temp 98.3°F | Resp 18 | Ht 71.0 in | Wt 235.0 lb

## 2015-08-17 DIAGNOSIS — Z79899 Other long term (current) drug therapy: Secondary | ICD-10-CM | POA: Diagnosis not present

## 2015-08-17 DIAGNOSIS — K219 Gastro-esophageal reflux disease without esophagitis: Secondary | ICD-10-CM | POA: Insufficient documentation

## 2015-08-17 DIAGNOSIS — M1990 Unspecified osteoarthritis, unspecified site: Secondary | ICD-10-CM | POA: Diagnosis not present

## 2015-08-17 DIAGNOSIS — Z0001 Encounter for general adult medical examination with abnormal findings: Secondary | ICD-10-CM | POA: Insufficient documentation

## 2015-08-17 DIAGNOSIS — N528 Other male erectile dysfunction: Secondary | ICD-10-CM

## 2015-08-17 DIAGNOSIS — N529 Male erectile dysfunction, unspecified: Secondary | ICD-10-CM | POA: Insufficient documentation

## 2015-08-17 DIAGNOSIS — M25511 Pain in right shoulder: Secondary | ICD-10-CM

## 2015-08-17 DIAGNOSIS — H538 Other visual disturbances: Secondary | ICD-10-CM

## 2015-08-17 DIAGNOSIS — M79604 Pain in right leg: Secondary | ICD-10-CM | POA: Diagnosis not present

## 2015-08-17 MED ORDER — TRAMADOL HCL 50 MG PO TABS: 50.0000 mg | ORAL_TABLET | Freq: Three times a day (TID) | ORAL | Status: DC | PRN

## 2015-08-17 MED ORDER — CYCLOBENZAPRINE HCL 5 MG PO TABS: 5.0000 mg | ORAL_TABLET | Freq: Three times a day (TID) | ORAL | Status: DC | PRN

## 2015-08-17 MED ORDER — SILDENAFIL CITRATE 100 MG PO TABS: 50.0000 mg | ORAL_TABLET | Freq: Every day | ORAL | Status: DC | PRN

## 2015-08-17 MED FILL — !VIAGRA 100MG TABLET: 100 | 30 days supply | Qty: 5 | Fill #0

## 2015-08-17 MED FILL — CYCLOBENZAPRINE 5 MG TABLET: 5 | 10 days supply | Qty: 30 | Fill #0

## 2015-08-17 MED FILL — traMADol HCL 50 MG TABS: 50 | 20 days supply | Qty: 60 | Fill #0

## 2015-08-17 NOTE — Progress Notes (Signed)
Patient ID: Keith Bruce, male   DOB: 02-04-1951, 65 y.o.   MRN: [id number]   [name], is a 65 y.o. male  [id number]  [id number]  DOB - 04-13-1950  Chief Complaint  Patient presents with  . Follow-up    PAIN        Subjective:   Keith Bruce is a 65 y.o. male with history of GERD and ED here today for a follow up visit and medication refills. Patient reports pulling a muscle about 3 weeks ago, but pain has since subsided, no swelling. He request referral to ophthalmologist for routine eyes evaluation. Patient denies any depression. Patient has No headache, No chest pain, No abdominal pain, No Nausea, No new weakness tingling or numbness, No Cough, SOB.  Problem  Right Leg Pain   ALLERGIES: No Known Allergies  PAST MEDICAL HISTORY: Past Medical History  Diagnosis Date  . Arthritis   . GERD (gastroesophageal reflux disease)   . Personal history of colonic adenoma 12/29/2012  . Dyspnea   . Cough    MEDICATIONS AT HOME: Prior to Admission medications   Medication Sig Start Date End Date Taking? Authorizing Provider  beclomethasone (QVAR) 80 MCG/ACT inhaler Inhale 2 puffs into the lungs daily. 04/19/15  Yes Tresa Garter, MD  cyclobenzaprine (FLEXERIL) 5 MG tablet Take 1 tablet (5 mg total) by mouth 3 (three) times daily as needed for muscle spasms. 08/17/15  Yes Tresa Garter, MD  ibuprofen (ADVIL,MOTRIN) 800 MG tablet TAKE 1 TABLET BY MOUTH EVERY 8 HOURS AS NEEDED FOR MILD PAIN. 07/31/15  Yes Tresa Garter, MD  sildenafil (VIAGRA) 100 MG tablet Take 0.5-1 tablets (50-100 mg total) by mouth daily as needed for erectile dysfunction. 08/17/15  Yes Geovannie Vilar Essie Christine, MD  traMADol (ULTRAM) 50 MG tablet Take 1 tablet (50 mg total) by mouth every 8 (eight) hours as needed. 08/17/15   Tresa Garter, MD   Objective:   Filed Vitals:   08/17/15 1031  BP: 116/73  Pulse: 66  Temp: 98.3 F (36.8 C)  TempSrc: Oral  Resp: 18  Height: 5\' 11"  (1.803  m)  Weight: 235 lb (106.595 kg)  SpO2: 96%    Exam General appearance : Awake, alert, not in any distress. Speech Clear. Not toxic looking HEENT: Atraumatic and Normocephalic, pupils equally reactive to light and accomodation Neck: supple, no JVD. No cervical lymphadenopathy.  Chest:Good air entry bilaterally, no added sounds  CVS: S1 S2 regular, no murmurs.  Abdomen: Bowel sounds present, Non tender and not distended with no gaurding, rigidity or rebound. Extremities: B/L Lower Ext shows no edema, both legs are warm to touch Neurology: Awake alert, and oriented X 3, CN II-XII intact, Non focal Skin: No Rash  Data Review Lab Results  Component Value Date   HGBA1C 5.40 07/28/2014   HGBA1C 5.3 10/07/2012   Assessment & Plan   1. Other male erectile dysfunction Refill - sildenafil (VIAGRA) 100 MG tablet; Take 0.5-1 tablets (50-100 mg total) by mouth daily as needed for erectile dysfunction.  Dispense: 30 tablet; Refill: 3  2. Right leg pain  - cyclobenzaprine (FLEXERIL) 5 MG tablet; Take 1 tablet (5 mg total) by mouth 3 (three) times daily as needed for muscle spasms.  Dispense: 30 tablet; Refill: 1 - traMADol (ULTRAM) 50 MG tablet; Take 1 tablet (50 mg total) by mouth every 8 (eight) hours as needed.  Dispense: 60 tablet; Refill: 0  3. Right shoulder pain  - traMADol (ULTRAM) 50 MG tablet; Take  1 tablet (50 mg total) by mouth every 8 (eight) hours as needed.  Dispense: 60 tablet; Refill: 0  4. Blurry vision, left eye  - Ambulatory referral to Ophthalmology  Patient have been counseled extensively about nutrition and exercise  Return in about 6 months (around 02/17/2016) for Follow up Pain and comorbidities.  The patient was given clear instructions to go to ER or return to medical center if symptoms don't improve, worsen or new problems develop. The patient verbalized understanding. The patient was told to call to get lab results if they haven't heard anything in the next  week.   This note has been created with Surveyor, quantity. Any transcriptional errors are unintentional.    Angelica Chessman, MD, [place], Karilyn Cota, [place] and [facility] [place], [place]   08/17/2015, 11:17 AM

## 2015-08-17 NOTE — Progress Notes (Signed)
Patient is here for PAIN  Patient denies pain at this time. Patient pulled a muscle 3 weeks ago and states pain has subsided and muscle functions well.  Patient has not taken any medication today and patient has not eaten.  Patient denies any suicidal indeations at this time.  Patient request a refill on Viagra and a referral to Opthalmology.

## 2015-11-14 MED FILL — IBUPROFEN 800 MG TABLET: 800 | 20 days supply | Qty: 60 | Fill #1

## 2015-12-01 MED FILL — !VIAGRA 100MG TABLET: 100 | 29 days supply | Qty: 5 | Fill #1

## 2018-04-06 ENCOUNTER — Encounter: Payer: Self-pay | Admitting: Internal Medicine

## 2018-09-24 ENCOUNTER — Other Ambulatory Visit: Payer: Self-pay | Admitting: Urology

## 2018-09-24 DIAGNOSIS — R972 Elevated prostate specific antigen [PSA]: Secondary | ICD-10-CM

## 2018-10-20 ENCOUNTER — Ambulatory Visit
Admission: RE | Admit: 2018-10-20 | Discharge: 2018-10-20 | Disposition: A | Discharge status: Home / Self Care | Payer: Medicare HMO | Source: Ambulatory Visit | Attending: Urology | Admitting: Urology

## 2018-10-20 ENCOUNTER — Other Ambulatory Visit: Payer: Self-pay

## 2018-10-20 DIAGNOSIS — R972 Elevated prostate specific antigen [PSA]: Secondary | ICD-10-CM

## 2018-10-20 MED ORDER — GADOBENATE DIMEGLUMINE 529 MG/ML IV SOLN
20.0000 mL | Freq: Once | INTRAVENOUS | Status: AC | PRN
  Administered 2018-10-20: 20 mL via INTRAVENOUS

## 2020-10-10 ENCOUNTER — Emergency Department (HOSPITAL_BASED_OUTPATIENT_CLINIC_OR_DEPARTMENT_OTHER)
Admission: EM | Admit: 2020-10-10 | Discharge: 2020-10-10 | Disposition: A | Discharge status: Home / Self Care | Payer: Medicare HMO | Attending: Emergency Medicine | Admitting: Emergency Medicine

## 2020-10-10 ENCOUNTER — Encounter (HOSPITAL_BASED_OUTPATIENT_CLINIC_OR_DEPARTMENT_OTHER): Payer: Self-pay

## 2020-10-10 ENCOUNTER — Other Ambulatory Visit: Payer: Self-pay

## 2020-10-10 DIAGNOSIS — X58XXXA Exposure to other specified factors, initial encounter: Secondary | ICD-10-CM | POA: Diagnosis not present

## 2020-10-10 DIAGNOSIS — T18128A Food in esophagus causing other injury, initial encounter: Secondary | ICD-10-CM | POA: Diagnosis present

## 2020-10-10 MED ORDER — GLUCAGON HCL RDNA (DIAGNOSTIC) 1 MG IJ SOLR
1.0000 mg | Freq: Once | INTRAMUSCULAR | Status: AC
  Administered 2020-10-10: 1 mg via INTRAVENOUS
  Filled 2020-10-10: qty 1

## 2020-10-10 MED ORDER — PANTOPRAZOLE SODIUM 40 MG PO TBEC: 40.0000 mg | DELAYED_RELEASE_TABLET | Freq: Every day | ORAL | 0 refills | Status: AC

## 2020-10-10 NOTE — ED Triage Notes (Signed)
Pt states had reflux on Saturday after eating sausage. Hx of reflux. Then after eating steak last night states he feels like some is "stuck in his chest". Anything he eats or drinks wont stay down. Denies CP/Sob.

## 2020-10-10 NOTE — ED Notes (Signed)
Pt given water to drink. 

## 2020-10-10 NOTE — ED Provider Notes (Signed)
[facility] Provider Note  CSN: [id number] Arrival date & time: 10/10/20 6440    History Chief Complaint  Patient presents with   Vomiting    Keith Bruce is a 70 y.o. male with history of GERD has been managed with diet changes in the last 10 years reports he at some sausage 2 days ago which caused some flare up of his heart burn and then last night while eating some steak he got a piece of it stuck. He has been unable to swallow anything since then without immediately regurgitating it. No pain, fever, SOB.    Past Medical History:  Diagnosis Date   Arthritis    Cough    Dyspnea    GERD (gastroesophageal reflux disease)    Personal history of colonic adenoma 12/29/2012    Past Surgical History:  Procedure Laterality Date   BACK SURGERY     Age 46-35    Family History  Problem Relation Age of Onset   Heart disease Mother    Kidney disease Father    Asthma Other        Half sister    Social History   Tobacco Use   Smoking status: Never   Smokeless tobacco: Never  Vaping Use   Vaping Use: Never used  Substance Use Topics   Alcohol use: Yes    Alcohol/week: 3.0 - 4.0 standard drinks    Types: 3 - 4 Cans of beer per week    Comment: occasional   Drug use: No     Home Medications Prior to Admission medications   Medication Sig Start Date End Date Taking? Authorizing Provider  pantoprazole (PROTONIX) 40 MG tablet Take 1 tablet (40 mg total) by mouth daily for 14 days. 10/10/20 10/24/20 Yes Truddie Hidden, MD     Allergies    Patient has no known allergies.   Review of Systems   Review of Systems A comprehensive review of systems was completed and negative except as noted in HPI.    Physical Exam BP 117/74   Pulse 62   Temp 98.1 F (36.7 C) (Oral)   Resp 18   Ht 5\' 11"  (1.803 m)   Wt 104.3 kg   SpO2 99%   BMI 32.08 kg/m   Physical Exam Vitals and nursing note reviewed.  Constitutional:      Appearance:  Normal appearance.  HENT:     Head: Normocephalic and atraumatic.     Nose: Nose normal.     Mouth/Throat:     Mouth: Mucous membranes are moist.  Eyes:     Extraocular Movements: Extraocular movements intact.     Conjunctiva/sclera: Conjunctivae normal.  Cardiovascular:     Rate and Rhythm: Normal rate.  Pulmonary:     Effort: Pulmonary effort is normal.     Breath sounds: Normal breath sounds.  Abdominal:     General: Abdomen is flat.     Palpations: Abdomen is soft.     Tenderness: There is no abdominal tenderness.  Musculoskeletal:        General: No swelling. Normal range of motion.     Cervical back: Neck supple.  Skin:    General: Skin is warm and dry.  Neurological:     General: No focal deficit present.     Mental Status: He is alert.  Psychiatric:        Mood and Affect: Mood normal.     ED Results / Procedures / Treatments  Labs (all labs ordered are listed, but only abnormal results are displayed) Labs Reviewed - No data to display  EKG None   Radiology No results found.  Procedures Procedures  Medications Ordered in the ED Medications  glucagon (human recombinant) (GLUCAGEN) injection 1 mg (1 mg Intravenous Given 10/10/20 0954)     MDM Rules/Calculators/A&P MDM Patient with likely esophageal food impaction. Will try a dose of glucagon and if unsuccessful will discuss with GI.   ED Course  I have reviewed the triage vital signs and the nursing notes.  Pertinent labs & imaging results that were available during my care of the patient were reviewed by me and considered in my medical decision making (see chart for details).  Clinical Course as of 10/10/20 1425  Tue Oct 10, 2020  1035 Patient tolerating PO after glucagon. Feeling better and no longer feels like food is stuck. Recommend outpatient GI follow up, soft foods in the meantime, be sure to chew food well. Start Protonix. RTED if symptoms worsen, preferably one with endoscopy  capabilities.  [CS]    Clinical Course User Index [CS] Truddie Hidden, MD    Final Clinical Impression(s) / ED Diagnoses Final diagnoses:  Food impaction of esophagus, initial encounter    Rx / DC Orders ED Discharge Orders          Ordered    pantoprazole (PROTONIX) 40 MG tablet  Daily        10/10/20 1039             Truddie Hidden, MD 10/10/20 1425

## 2021-05-23 ENCOUNTER — Emergency Department (HOSPITAL_BASED_OUTPATIENT_CLINIC_OR_DEPARTMENT_OTHER): Payer: Medicare HMO

## 2021-05-23 ENCOUNTER — Encounter (HOSPITAL_BASED_OUTPATIENT_CLINIC_OR_DEPARTMENT_OTHER): Payer: Self-pay

## 2021-05-23 ENCOUNTER — Other Ambulatory Visit: Payer: Self-pay

## 2021-05-23 ENCOUNTER — Emergency Department (HOSPITAL_BASED_OUTPATIENT_CLINIC_OR_DEPARTMENT_OTHER)
Admission: EM | Admit: 2021-05-23 | Discharge: 2021-05-23 | Disposition: A | Discharge status: Home / Self Care | Payer: Medicare HMO | Attending: Emergency Medicine | Admitting: Emergency Medicine

## 2021-05-23 DIAGNOSIS — S8992XA Unspecified injury of left lower leg, initial encounter: Secondary | ICD-10-CM | POA: Diagnosis present

## 2021-05-23 DIAGNOSIS — W19XXXA Unspecified fall, initial encounter: Secondary | ICD-10-CM

## 2021-05-23 DIAGNOSIS — R001 Bradycardia, unspecified: Secondary | ICD-10-CM | POA: Insufficient documentation

## 2021-05-23 DIAGNOSIS — R009 Unspecified abnormalities of heart beat: Secondary | ICD-10-CM | POA: Diagnosis not present

## 2021-05-23 DIAGNOSIS — W1789XA Other fall from one level to another, initial encounter: Secondary | ICD-10-CM | POA: Diagnosis not present

## 2021-05-23 DIAGNOSIS — M25531 Pain in right wrist: Secondary | ICD-10-CM | POA: Diagnosis not present

## 2021-05-23 DIAGNOSIS — S8002XA Contusion of left knee, initial encounter: Secondary | ICD-10-CM | POA: Diagnosis not present

## 2021-05-23 DIAGNOSIS — M25562 Pain in left knee: Secondary | ICD-10-CM

## 2021-05-23 NOTE — Discharge Instructions (Addendum)
You were seen here today for evaluation of your left knee pain. Your imaging did not show any acute fracture. I have referred you to Dr. Clearance Coots, and orthopedic surgeon, for you to follow up with outpatient. This may require additional imaging. Please take ibuprofen or Tylenol as needed for pain. I have included additional information on the RICE method in the discharge paperwork. If you have any worsening swelling, color changes, numbness, tingling, please return to the ER for evaluation.

## 2021-05-23 NOTE — ED Provider Notes (Addendum)
[facility] Provider Note   CSN: [id number] Arrival date & time: 05/23/21  1806     History Chief Complaint  Patient presents with   Keith Bruce is a 71 y.o. male with h/o GERD presents to the emergency department for evaluation of left knee and right wrist pain after a fall 1 week ago.  Patient reports he was stepping out of his truck and onto a concrete dock when he lost his balance and fell landing on his outstretched right arm.  He denies any head or neck injury or pain.  Patient reports he is in wearing a soft knee brace and is noticing some improvement of his pain but he reports he turned and felt a pop in his knee.  He has been ambulatory in weightbearing although with some pain.  Denies any numbness, tingling, weakness to the leg.  Denies any swelling.  The patient reports he has had a bruise under his left knee since the initial fall.  He reports his right wrist pain has improved greatly and is barely causing him any trouble.   Fall      Home Medications Prior to Admission medications   Medication Sig Start Date End Date Taking? Authorizing Provider  pantoprazole (PROTONIX) 40 MG tablet Take 1 tablet (40 mg total) by mouth daily for 14 days. 10/10/20 10/24/20  Truddie Hidden, MD      Allergies    Patient has no known allergies.    Review of Systems   Review of Systems  Musculoskeletal:  Positive for arthralgias and myalgias. Negative for back pain and neck pain.  Neurological:  Negative for weakness and numbness.   Physical Exam Updated Vital Signs BP 130/85    Pulse (!) 58    Temp 99.3 F (37.4 C) (Oral)    Resp 18    Ht 5\' 11"  (1.803 m)    Wt 111.1 kg    SpO2 98%    BMI 34.17 kg/m  Physical Exam Vitals and nursing note reviewed.  Constitutional:      General: He is not in acute distress.    Appearance: Normal appearance. He is not ill-appearing or toxic-appearing.  HENT:     Head: Normocephalic and atraumatic.   Eyes:     General: No scleral icterus. Cardiovascular:     Rate and Rhythm: Normal rate. Rhythm irregular.     Pulses: Normal pulses.  Pulmonary:     Effort: Pulmonary effort is normal. No respiratory distress.     Breath sounds: Normal breath sounds.  Musculoskeletal:        General: Swelling, tenderness and signs of injury present. No deformity. Normal range of motion.     Right lower leg: No edema.     Left lower leg: No edema.     Comments: Palpable DP and PT pulses bilateral.  Sensation intact throughout.  Patient has a moderate-sized fading bruise to the inferior aspect of the left knee.  No palpable crepitus.  Mild swelling to the knee entire knee.  The patient has full extension and flexion of the knee and ankle with only some pain.  Compartments are soft. Negative anterior and posterior drawer test.   Full range of motion of the right wrist, thumb, and fingers. Compartments soft. No snuffbox tenderness.  Mild tenderness in the palm on the thenar eminence side mainly.  No deformities, abrasions, lacerations, ecchymosis, or any other overlying skin changes noted.  Palpable radial pulse.  Skin:  General: Skin is dry.     Findings: No rash.  Neurological:     General: No focal deficit present.     Mental Status: He is alert. Mental status is at baseline.     Sensory: No sensory deficit.  Psychiatric:        Mood and Affect: Mood normal.     ED Results / Procedures / Treatments   Labs (all labs ordered are listed, but only abnormal results are displayed) Labs Reviewed - No data to display  EKG EKG Interpretation  Date/Time:  Wednesday May 23 2021 19:13:33 EST Ventricular Rate:  68 PR Interval:  142 QRS Duration: 90 QT Interval:  395 QTC Calculation: 421 R Axis:   26 Text Interpretation: Sinus rhythm Atrial premature complex Abnormal R-wave progression, early transition Confirmed by Madalyn Rob [phone]) on 05/23/2021 7:18:26 PM  Radiology DG Wrist Complete  Right  Result Date: 05/23/2021 CLINICAL DATA:  Fall with pain EXAM: RIGHT WRIST - COMPLETE 3+ VIEW COMPARISON:  None. FINDINGS: No fracture or malalignment. Degenerative changes at the first [facility] joint and distal radioulnar joint. IMPRESSION: No acute osseous abnormality Electronically Signed   By: Donavan Foil M.D.   On: 05/23/2021 19:11   DG Tibia/Fibula Left  Result Date: 05/23/2021 CLINICAL DATA:  Fall and pain. Fell 1 week ago. Turned and felt a pop in knee. EXAM: LEFT KNEE - COMPLETE 4+ VIEW; LEFT TIBIA AND FIBULA - 2 VIEW COMPARISON:  None. FINDINGS: Left knee: Mild medial compartment joint space narrowing. Mild-to-moderate chronic ossification of the superomedial aspect of the medial femoral condyle, possibly from a remote injury to the medial collateral ligament. No acute fracture or dislocation. Left tibia and fibula: No acute fracture. Mild distal medial malleolar degenerative osteophytosis. Mild-to-moderate dorsal talonavicular degenerative osteophytosis. Mild-to-moderate chronic spurring at the Achilles and plantar fascia insertions on the calcaneus. IMPRESSION:: IMPRESSION: 1. 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 2. Mild-to-moderate chronic spurring at the Achilles and plantar fascia insertions on the calcaneus. Electronically Signed   By: Yvonne Kendall M.D.   On: 05/23/2021 19:12   DG Knee Complete 4 Views Left  Result Date: 05/23/2021 CLINICAL DATA:  Fall and pain. Fell 1 week ago. Turned and felt a pop in knee. EXAM: LEFT KNEE - COMPLETE 4+ VIEW; LEFT TIBIA AND FIBULA - 2 VIEW COMPARISON:  None. FINDINGS: Left knee: Mild medial compartment joint space narrowing. Mild-to-moderate chronic ossification of the superomedial aspect of the medial femoral condyle, possibly from a remote injury to the medial collateral ligament. No acute fracture or dislocation. Left tibia and fibula: No acute fracture. Mild distal  medial malleolar degenerative osteophytosis. Mild-to-moderate dorsal talonavicular degenerative osteophytosis. Mild-to-moderate chronic spurring at the Achilles and plantar fascia insertions on the calcaneus. IMPRESSION:: IMPRESSION: 1. 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 2. Mild-to-moderate chronic spurring at the Achilles and plantar fascia insertions on the calcaneus. Electronically Signed   By: Yvonne Kendall M.D.   On: 05/23/2021 19:12    Procedures Procedures   Medications Ordered in ED Medications - No data to display  ED Course/ Medical Decision Making/ A&P                           Medical Decision Making Amount and/or Complexity of Data Reviewed Radiology: ordered.   71 year old male presents emerged department for evaluation of right wrist and left knee  pain after a fall.  Differential diagnosis includes was not limited to scaphoid fracture, dislocation, wrist fracture, the mentis or tendon damage/injury, knee fracture, knee dislocation.  Vital signs show borderline bradycardia although the patient has an irregular rhythm.  Ordered EKG that shows premature atrial complexes.  My attending recommended no further work-up.  Physical exam is pertinent for some ecchymosis to the inferior aspect of the left knee.  Full range of motion with some pain.  Compartments are soft.  Sensations intact throughout.  Pulses are normal.  No snuffbox tenderness on the right wrist and he has full range of motion of his wrist, thumb, and fingers.  No trauma noted to the wrist.  We will get imaging.  As mentioned before, the patient had an irregular heartbeat so got EKG that shows premature atrial complexes.  Patient mention for that he has had an irregular heartbeat since he turned 65.  I independently reviewed and interpreted the patient's imaging and agree with radiologist findings.  Mild to moderate chronic ossification of  the medial collateral ligament origin possibly sequela of her remote trauma and also mild to moderate chronic spurring of Achilles and plantar fascia insertion of the calcaneus seen on knee and tib-fib x-rays on the left.  Right wrist x-ray shows degenerative changes although nothing acute.   The patient does not have any step-offs tenderness and has a negative wrist x-ray as well as improving pain, no need to cast at this time.  Patient does have some pain with his left knee but is ambulatory with pain.  Will refer to nonsurgical Ortho provider, Dr. Clearance Coots, for further evaluation and possible further imaging.  My attending assessed at bedside and reported that the patient can wear his soft brace from at home and does not need a need immobilizer or crutches. Recommended the patient take Tylenol or ibuprofen as needed for pain.  Return precautions discussed by my attending. Patient is stable being discharged home in good condition.  I discussed this case with my attending physician who cosigned this note including patient's presenting symptoms, physical exam, and planned diagnostics and interventions. Attending physician stated agreement with plan or made changes to plan which were implemented.   Attending physician assessed patient at bedside.  Final Clinical Impression(s) / ED Diagnoses Final diagnoses:  Acute pain of left knee  Fall, initial encounter  Right wrist pain    Rx / DC Orders ED Discharge Orders     None         Sherrell Puller, PA-C 05/23/21 1949    Sherrell Puller, PA-C 05/23/21 2005    Sherrell Puller, PA-C 05/26/21 2043    Lucrezia Starch, MD 05/27/21 1517

## 2021-05-23 NOTE — ED Triage Notes (Signed)
Pt states he fell 1 week ago-pain to right wrist and both knees-today he turned and felt a pop in left knee-NAD-to triage in w/c

## 2022-05-08 ENCOUNTER — Emergency Department (HOSPITAL_BASED_OUTPATIENT_CLINIC_OR_DEPARTMENT_OTHER): Payer: Medicare HMO

## 2022-05-08 ENCOUNTER — Other Ambulatory Visit: Payer: Self-pay

## 2022-05-08 ENCOUNTER — Emergency Department (HOSPITAL_BASED_OUTPATIENT_CLINIC_OR_DEPARTMENT_OTHER)
Admission: EM | Admit: 2022-05-08 | Discharge: 2022-05-08 | Disposition: A | Discharge status: Home / Self Care | Payer: Medicare HMO | Attending: Emergency Medicine | Admitting: Emergency Medicine

## 2022-05-08 DIAGNOSIS — S6991XA Unspecified injury of right wrist, hand and finger(s), initial encounter: Secondary | ICD-10-CM | POA: Diagnosis present

## 2022-05-08 DIAGNOSIS — S6721XA Crushing injury of right hand, initial encounter: Secondary | ICD-10-CM

## 2022-05-08 DIAGNOSIS — S60221A Contusion of right hand, initial encounter: Secondary | ICD-10-CM

## 2022-05-08 DIAGNOSIS — W231XXA Caught, crushed, jammed, or pinched between stationary objects, initial encounter: Secondary | ICD-10-CM | POA: Diagnosis not present

## 2022-05-08 MED ORDER — BACITRACIN ZINC 500 UNIT/GM EX OINT
TOPICAL_OINTMENT | Freq: Two times a day (BID) | CUTANEOUS | Status: DC
  Filled 2022-05-08: qty 28.35

## 2022-05-08 NOTE — ED Triage Notes (Signed)
Patient presents to ED via POV from home. Here with right hand injury while working on car today. Minor abrasions noted. No active bleeding. Positive PMS.

## 2022-05-08 NOTE — Discharge Instructions (Signed)
Your x-ray did not show a fracture of your right hand.  You can continue to apply ice at home.  You can wash your hand wound with regular soap and water.  Please keep your hand clean for the next 7 days while the skin heals

## 2022-05-08 NOTE — ED Provider Notes (Signed)
[place] [facility] Provider Note   CSN: [id number] Arrival date & time: 05/08/22  1113     History  Chief Complaint  Patient presents with   Hand Pain    Keith Bruce is a 72 y.o. male present to ED with injury to his right hand.  Patient ports he was changing a tire and the jack slipped and he caught his finger between the jack and the car.  He is right-handed.  Denies any other injuries.  He is not on blood thinners.  HPI     Home Medications Prior to Admission medications   Medication Sig Start Date End Date Taking? Authorizing Provider  pantoprazole (PROTONIX) 40 MG tablet Take 1 tablet (40 mg total) by mouth daily for 14 days. 10/10/20 10/24/20  Truddie Hidden, MD      Allergies    Patient has no known allergies.    Review of Systems   Review of Systems  Physical Exam Updated Vital Signs BP 111/72   Pulse (!) 58   Temp 97.9 F (36.6 C) (Oral)   Resp 16   Ht '5\' 11"'$  (1.803 m)   Wt 108.9 kg   SpO2 96%   BMI 33.47 kg/m  Physical Exam Constitutional:      General: He is not in acute distress. HENT:     Head: Normocephalic and atraumatic.  Eyes:     Conjunctiva/sclera: Conjunctivae normal.     Pupils: Pupils are equal, round, and reactive to light.  Cardiovascular:     Rate and Rhythm: Normal rate and regular rhythm.     Pulses: Normal pulses.  Pulmonary:     Effort: Pulmonary effort is normal. No respiratory distress.  Skin:    General: Skin is warm and dry.     Comments: Skin abrasions breakdown and partial-thickness type abrasion injury to the right proximal second and third digit, near the MCP. Patient is able to full flex and extend all fingers.  Neurological:     General: No focal deficit present.     Mental Status: He is alert. Mental status is at baseline.     Sensory: No sensory deficit.  Psychiatric:        Mood and Affect: Mood normal.        Behavior: Behavior normal.     ED Results /  Procedures / Treatments   Labs (all labs ordered are listed, but only abnormal results are displayed) Labs Reviewed - No data to display  EKG None  Radiology DG Hand Complete Right  Result Date: 05/08/2022 CLINICAL DATA:  Right hand crush injury today. EXAM: RIGHT HAND - COMPLETE 3+ VIEW COMPARISON:  None Available. FINDINGS: There is no evidence of fracture or dislocation. Mild degenerative changes seen involving the first carpometacarpal joint. Soft tissues are unremarkable. IMPRESSION: No acute abnormality seen. Electronically Signed   By: Marijo Conception M.D.   On: 05/08/2022 12:06    Procedures Procedures    Medications Ordered in ED Medications  bacitracin ointment ( Topical Given 05/08/22 1239)    ED Course/ Medical Decision Making/ A&P                             Medical Decision Making Amount and/or Complexity of Data Reviewed Radiology: ordered.  Risk OTC drugs.   Patient is here with a crush type injury to the right hand.  He is able to fully flex and extend  all digits in the hand.  No visible fracture on x-ray imaging per my review.  He is neurovascularly intact.  No evidence of compartment syndrome.  His wound was dressed with some bacitracin ointment and clean bandaging, and he is stable for discharge at this time.        Final Clinical Impression(s) / ED Diagnoses Final diagnoses:  Contusion of right hand, initial encounter  Crushing injury of right hand, initial encounter    Rx / DC Orders ED Discharge Orders     None         Chrisean Kloth, Carola Rhine, MD 05/08/22 1501

## 2022-08-21 ENCOUNTER — Emergency Department (HOSPITAL_BASED_OUTPATIENT_CLINIC_OR_DEPARTMENT_OTHER)
Admission: EM | Admit: 2022-08-21 | Discharge: 2022-08-21 | Disposition: A | Discharge status: Home / Self Care | Payer: Medicare HMO | Attending: Emergency Medicine | Admitting: Emergency Medicine

## 2022-08-21 ENCOUNTER — Emergency Department (HOSPITAL_BASED_OUTPATIENT_CLINIC_OR_DEPARTMENT_OTHER): Payer: Medicare HMO

## 2022-08-21 ENCOUNTER — Other Ambulatory Visit: Payer: Self-pay

## 2022-08-21 ENCOUNTER — Encounter (HOSPITAL_BASED_OUTPATIENT_CLINIC_OR_DEPARTMENT_OTHER): Payer: Self-pay | Admitting: Radiology

## 2022-08-21 DIAGNOSIS — M5136 Other intervertebral disc degeneration, lumbar region: Secondary | ICD-10-CM | POA: Insufficient documentation

## 2022-08-21 DIAGNOSIS — K921 Melena: Secondary | ICD-10-CM | POA: Insufficient documentation

## 2022-08-21 DIAGNOSIS — K5900 Constipation, unspecified: Secondary | ICD-10-CM | POA: Insufficient documentation

## 2022-08-21 DIAGNOSIS — K8020 Calculus of gallbladder without cholecystitis without obstruction: Secondary | ICD-10-CM | POA: Insufficient documentation

## 2022-08-21 DIAGNOSIS — R195 Other fecal abnormalities: Secondary | ICD-10-CM | POA: Insufficient documentation

## 2022-08-21 DIAGNOSIS — N400 Enlarged prostate without lower urinary tract symptoms: Secondary | ICD-10-CM | POA: Insufficient documentation

## 2022-08-21 DIAGNOSIS — I700 Atherosclerosis of aorta: Secondary | ICD-10-CM | POA: Diagnosis not present

## 2022-08-21 DIAGNOSIS — R197 Diarrhea, unspecified: Secondary | ICD-10-CM | POA: Insufficient documentation

## 2022-08-21 LAB — COMPREHENSIVE METABOLIC PANEL
ALT: 22 U/L (ref 0–44)
AST: 26 U/L (ref 15–41)
Albumin: 3.7 g/dL (ref 3.5–5.0)
Alkaline Phosphatase: 84 U/L (ref 38–126)
Anion gap: 8 (ref 5–15)
BUN: 19 mg/dL (ref 8–23)
CO2: 25 mmol/L (ref 22–32)
Calcium: 8.5 mg/dL — ABNORMAL LOW (ref 8.9–10.3)
Chloride: 102 mmol/L (ref 98–111)
Creatinine, Ser: 0.81 mg/dL (ref 0.61–1.24)
GFR, Estimated: >60 mL/min (ref >60)
Glucose, Bld: 111 mg/dL — ABNORMAL HIGH (ref 70–99)
Potassium: 3.5 mmol/L (ref 3.5–5.1)
Sodium: 135 mmol/L (ref 135–145)
Total Bilirubin: 0.7 mg/dL (ref 0.3–1.2)
Total Protein: 6.6 g/dL (ref 6.5–8.1)

## 2022-08-21 LAB — CBC WITH DIFFERENTIAL/PLATELET
Abs Immature Granulocytes: 0.03 10*3/uL (ref 0.00–0.07)
Basophils Absolute: 0.1 10*3/uL (ref 0.0–0.1)
Basophils Relative: 1 %
Eosinophils Absolute: 0.3 10*3/uL (ref 0.0–0.5)
Eosinophils Relative: 4 %
HCT: 41.4 % (ref 39.0–52.0)
Hemoglobin: 14.2 g/dL (ref 13.0–17.0)
Immature Granulocytes: 0 %
Lymphocytes Relative: 26 %
Lymphs Abs: 2.1 10*3/uL (ref 0.7–4.0)
MCH: 31 pg (ref 26.0–34.0)
MCHC: 34.3 g/dL (ref 30.0–36.0)
MCV: 90.4 fL (ref 80.0–100.0)
Monocytes Absolute: 0.7 10*3/uL (ref 0.1–1.0)
Monocytes Relative: 9 %
Neutro Abs: 4.9 10*3/uL (ref 1.7–7.7)
Neutrophils Relative %: 60 %
Platelets: 225 10*3/uL (ref 150–400)
RBC: 4.58 MIL/uL (ref 4.22–5.81)
RDW: 11.9 % (ref 11.5–15.5)
WBC: 8.1 10*3/uL (ref 4.0–10.5)
nRBC: 0 % (ref 0.0–0.2)

## 2022-08-21 LAB — PROTIME-INR
INR: 0.9 (ref 0.8–1.2)
Prothrombin Time: 12.2 seconds (ref 11.4–15.2)

## 2022-08-21 LAB — LIPASE, BLOOD: Lipase: 36 U/L (ref 11–51)

## 2022-08-21 LAB — LACTIC ACID, PLASMA: Lactic Acid, Venous: 1.5 mmol/L (ref 0.5–1.9)

## 2022-08-21 LAB — OCCULT BLOOD X 1 CARD TO LAB, STOOL: Fecal Occult Bld: NEGATIVE

## 2022-08-21 MED ORDER — IOHEXOL 300 MG/ML  SOLN
100.0000 mL | Freq: Once | INTRAMUSCULAR | Status: AC | PRN
  Administered 2022-08-21: 100 mL via INTRAVENOUS

## 2022-08-21 NOTE — Discharge Instructions (Addendum)
Your history, exam, initial evaluation were overall reassuring.  The CT scan did not showed some obstruction but did show you have gallstones in your gallbladder that do not appear to be causing problems today.  We recommend follow-up with outpatient general surgery for this especially with starts giving you pain in your right upper abdomen.  Please follow-up on the result of the C. difficile testing with MyChart.  If it is positive, they should call you to order antibiotics.  Otherwise please rest and stay hydrated and follow-up with the GI doctor for your month of irregular bowel movements.  If any symptoms change or worsen acutely, please return to the nearest emergency department.

## 2022-08-21 NOTE — ED Triage Notes (Signed)
Patient presents to ED via POV from home. Here with 1 month history of diarrhea and dark stool. Well appearing.

## 2022-08-21 NOTE — ED Notes (Signed)
Pt states he is unable to provide stool sample at this time, he relates its only gas.  Cup and specimen collection hat given to patient to return once sample has been obtained at home.

## 2022-08-21 NOTE — ED Provider Notes (Signed)
[facility] Provider Note   CSN: [id number] Arrival date & time: 08/21/22  1230     History  Chief Complaint  Patient presents with   Diarrhea    Keith Bruce is a 72 y.o. male.   Diarrhea Quality:  Watery and black and tarry Severity:  Severe Onset quality:  Gradual Duration:  1 month Timing:  Intermittent Progression:  Unchanged Relieved by:  Nothing Worsened by:  Nothing Ineffective treatments:  None tried Associated symptoms: cough (chronic)   Associated symptoms: no abdominal pain, no chills, no diaphoresis, no fever, no headaches, no URI and no vomiting        Home Medications Prior to Admission medications   Medication Sig Start Date End Date Taking? Authorizing Provider  pantoprazole (PROTONIX) 40 MG tablet Take 1 tablet (40 mg total) by mouth daily for 14 days. 10/10/20 10/24/20  Pollyann Savoy, MD      Allergies    Patient has no known allergies.    Review of Systems   Review of Systems  Constitutional:  Negative for chills, diaphoresis, fatigue and fever.  HENT:  Negative for congestion.   Respiratory:  Negative for cough, chest tightness, shortness of breath and wheezing.   Cardiovascular:  Negative for chest pain, palpitations and leg swelling.  Gastrointestinal:  Positive for constipation and diarrhea. Negative for abdominal pain, nausea and vomiting.  Genitourinary:  Negative for dysuria and flank pain.  Musculoskeletal:  Negative for back pain, neck pain and neck stiffness.  Skin:  Negative for rash and wound.  Neurological:  Negative for light-headedness and headaches.  Psychiatric/Behavioral:  Negative for agitation and confusion.   All other systems reviewed and are negative.   Physical Exam Updated Vital Signs BP 121/69 (BP Location: Left Arm)   Pulse 81   Temp 98.3 F (36.8 C)   Resp 20   Ht 5\' 11"  (1.803 m)   Wt 111.1 kg   SpO2 96%   BMI 34.17 kg/m  Physical Exam Vitals and  nursing note reviewed.  Constitutional:      General: He is not in acute distress.    Appearance: He is well-developed. He is not ill-appearing, toxic-appearing or diaphoretic.  HENT:     Head: Normocephalic and atraumatic.     Nose: No congestion.     Mouth/Throat:     Mouth: Mucous membranes are moist.     Pharynx: No oropharyngeal exudate or posterior oropharyngeal erythema.  Eyes:     Extraocular Movements: Extraocular movements intact.     Conjunctiva/sclera: Conjunctivae normal.     Pupils: Pupils are equal, round, and reactive to light.  Cardiovascular:     Rate and Rhythm: Normal rate and regular rhythm.     Heart sounds: No murmur heard. Pulmonary:     Effort: Pulmonary effort is normal. No respiratory distress.     Breath sounds: Normal breath sounds. No wheezing, rhonchi or rales.  Chest:     Chest wall: No tenderness.  Abdominal:     General: Abdomen is flat. There is no distension.     Palpations: Abdomen is soft.     Tenderness: There is no abdominal tenderness. There is no right CVA tenderness, left CVA tenderness, guarding or rebound.  Genitourinary:    Rectum: Guaiac result negative.  Musculoskeletal:        General: No swelling or tenderness.     Cervical back: Neck supple. No tenderness.     Right lower leg: No  edema.     Left lower leg: No edema.  Skin:    General: Skin is warm and dry.     Capillary Refill: Capillary refill takes less than 2 seconds.     Findings: No erythema or rash.  Neurological:     General: No focal deficit present.     Mental Status: He is alert.     Sensory: No sensory deficit.     Motor: No weakness.  Psychiatric:        Mood and Affect: Mood normal.     ED Results / Procedures / Treatments   Labs (all labs ordered are listed, but only abnormal results are displayed) Labs Reviewed  COMPREHENSIVE METABOLIC PANEL - Abnormal; Notable for the following components:      Result Value   Glucose, Bld 111 (*)    Calcium 8.5  (*)    All other components within normal limits  C DIFFICILE QUICK SCREEN W PCR REFLEX    OCCULT BLOOD X 1 CARD TO LAB, STOOL  CBC WITH DIFFERENTIAL/PLATELET  LACTIC ACID, PLASMA  LIPASE, BLOOD  PROTIME-INR    EKG None  Radiology DG Chest 2 View  Result Date: 08/21/2022 CLINICAL DATA:  Cough. Abdominal distension. Abnormal bowel movements. EXAM: CHEST - 2 VIEW COMPARISON:  02/04/2015 FINDINGS: The heart size and mediastinal contours are within normal limits. Both lungs are clear. The visualized skeletal structures are unremarkable. IMPRESSION: No active cardiopulmonary disease. Electronically Signed   By: Paulina Fusi M.D.   On: 08/21/2022 14:42   CT ABDOMEN PELVIS W CONTRAST  Result Date: 08/21/2022 CLINICAL DATA:  Bowel obstruction suspected. Abnormal bowel movements. EXAM: CT ABDOMEN AND PELVIS WITH CONTRAST TECHNIQUE: Multidetector CT imaging of the abdomen and pelvis was performed using the standard protocol following bolus administration of intravenous contrast. RADIATION DOSE REDUCTION: This exam was performed according to the departmental dose-optimization program which includes automated exposure control, adjustment of the mA and/or kV according to patient size and/or use of iterative reconstruction technique. CONTRAST:  OMNIPAQUE IOHEXOL 300 MG/ML  SOLN COMPARISON:  03/07/2009 FINDINGS: Lower chest: Lung bases are clear. Hepatobiliary: Liver parenchyma is normal. The gallbladder is filled with gallstones. No CT evidence of cholecystitis or obstruction. Pancreas: Normal Spleen: Normal Adrenals/Urinary Tract: Adrenal glands are normal. Kidneys are normal. No cyst, mass, stone or hydronephrosis. Bladder is normal. Stomach/Bowel: Stomach and small intestine are normal. Normal appendix. Normal colon. No abnormal amount of gas or fecal matter. No diverticulosis or diverticulitis. Vascular/Lymphatic: Aortic atherosclerosis. No aneurysm. IVC is normal. No adenopathy. Reproductive:  Enlarged prostate.  Otherwise normal. Other: No free fluid or air. Musculoskeletal: Ordinary lower lumbar degenerative changes. IMPRESSION: 1. No evidence of bowel obstruction or other acute bowel pathology. 2. The gallbladder is filled with gallstones. No CT evidence of cholecystitis or obstruction however. 3. Aortic atherosclerosis. 4. Enlarged prostate. 5. Ordinary lower lumbar degenerative changes. Aortic Atherosclerosis (ICD10-I70.0). Electronically Signed   By: Paulina Fusi M.D.   On: 08/21/2022 14:41    Procedures Procedures    Medications Ordered in ED Medications  iohexol (OMNIPAQUE) 300 MG/ML solution 100 mL (100 mLs Intravenous Contrast Given 08/21/22 1411)    ED Course/ Medical Decision Making/ A&P                             Medical Decision Making Amount and/or Complexity of Data Reviewed Labs: ordered. Radiology: ordered.  Risk Prescription drug management.    Keith Bruce is  a 72 y.o. male with a past medical history significant for GERD, previous colonic adenoma, and arthritis who presents with 1 month of waxing and waning diarrhea, black stools, abdominal distention, and constipation.  According to patient, for about a month or so he has had waxing and waning very watery loose black diarrhea alternating with constipation and no passing of gas.  He reports he has never had this combination before.  He reports he does feel his abdomen is more distended at times.  He denies significant abdominal pain.  He denies any urinary changes to me and denies trauma.  He denies dysuria but told nursing he had some difficulty urinating at times.  He reports no nausea or vomiting and denies otherwise fevers or chills.  He reports no blood in his stools but he does state they are dark.  He does take Tums frequently.  Denies any chest pain or shortness of breath but has some chronic cough.  Denies any other medication changes.  On exam, abdomen is distended and there are high-pitched  bowel sounds.  Abdomen not focally tender and back nontender.  Lungs clear.  No murmur.  No focal neurologic deficits.  Patient resting.  Fecal occult test was collected and there was no gross blood.  Will wait for official occult test result.  Given the patient's abdominal distention and the high pitch bowel sounds, will get a CT scan to rule out bowel obstruction or megacolon.  Will send off a C. difficile test given about 1 month of diarrhea.  His dark appearance of the stool could be due to the Tums he is taking but will also wait for the occult test.  Will get screening labs.  Will get a chest x-ray with his cough.  With his report to nursing of some difficulty with urination at times will get urinalysis.  Anticipate reassessment after workup to determine disposition.  2:52 PM CT scan shows no evidence of obstruction.  It does show gallstones.  Labs are returning reassuring but still waiting on urinalysis and C. difficile test to be collected.    Will reassess patient and discuss if he wants to wait for C. difficile test or urinalysis.  As there is no evidence of obstruction or other critical findings, we feel he is likely safe for discharge home.  His fecal occult was negative, doubt bleeding and his dark stool is likely due to the amount of Pepto-Bismol he has been taking.  His CBC is complete reassuring without anemia.  Metabolic panel also overall reassuring.  3:05 PM Patient does not want to get a urinalysis as there is no urinary symptoms but he does want to sign up for C. difficile test.  We went through all of his findings and he feels reassured.  He will continue his oral hydration at home and will follow-up on the C. difficile testing.  If something is positive, anticipate he will be called.  Patient will follow-up with a GI doctor for his irregular bowels and it does not show evidence of bleeding.  He agreed with plan of care and was discharged after stool  collection.         Final Clinical Impression(s) / ED Diagnoses Final diagnoses:  Diarrhea, unspecified type  Constipation, unspecified constipation type  Dark stools    Rx / DC Orders ED Discharge Orders     None      Clinical Impression: 1. Diarrhea, unspecified type   2. Constipation, unspecified constipation type   3.  Dark stools     Disposition: Discharge  Condition: Good  I have discussed the results, Dx and Tx plan with the pt(& family if present). He/she/they expressed understanding and agree(s) with the plan. Discharge instructions discussed at great length. Strict return precautions discussed and pt &/or family have verbalized understanding of the instructions. No further questions at time of discharge.    New Prescriptions   No medications on file    Follow Up: Gastroenterology, [facility] Spr[facility] [address] [place] [address] [phone]     [facility] Emergency Department at [facility] [address] [id number] [address] [address] [phone]    Surgery, [place] [address] [place] [address] [place] [address] [phone]   for the gallbladder     Jammi Morrissette, Canary Brim, MD 08/21/22 [phone]

## 2022-08-21 NOTE — ED Notes (Signed)
Pt. Reports last BM was this morning and was hard he had to strain.  Color of BM was black.  He is on no iron.  Pt. Reports he drinks approx. 5 beers a week.  He does drink wine occasionally.  He does Not drink mixed drinks.  Pt. Reports seeing No Blood in his stool.

## 2024-01-15 NOTE — Progress Notes (Signed)
 [facility]   [facility] Established Patient Note    Date of Service: 01/16/2024        Patient Name: Keith Bruce.      Date of Birth: 1950-11-09      MRN: [id number]       Primary Care Physician: Ronal Comer Corona, PA-C       Main Pain Anesthesiologist: Darwyn Blanch, MD Main Pain APP: Alana Search, PA-C Last Office visit: 11/07/2023 with Alana Search, PA-C Last Procedure Visit: 12/09/2023 with Janus Blanch, MD for cervical RFA   Chief Complaint:  Chief Complaint  Patient presents with  . Neck Pain  . Shoulder Pain    Bilateral  . Hand Pain    Bilateral     History of Present Illness:  Keith Krinke. is a 73 y.o. old male.   Pain Assessment Pain Score  : 3  What number best describes your pain on average in the past week?: 5 What number best describes how, during the past week, pain has interfered with your enjoyment of life?: 5 What number best describes how, during the past week, pain has interfered with your general activity?: 5 PEG Pain Total Score: 5  Pain Description: Since the last visit the pain is improved. The most significant location of pain is the neck. Other areas of pain include the bilateral shoulders and bilateral hands.  The 1-2 words that best describe the pain: shooting The pain improves with radiofrequency ablation, heat therapy, and cold therapy. The pain is made worse with reaching above head. The average daily pain score is 5/10. The pain can be as high as 8/10 at its worst. The pain interferes with: Nothing.   Therapies: Did the patient have an injection/procedure since the last visit? Yes, Bilateral cervical RFA 12/09/23             - Percentage of pain relief from injection/procedure: 80%   Has the patient had any new imaging (X-ray, MRI, CT) for pain concerns since the last visit? No Has physical therapy been initiated or completed for this pain concern? No    Medications: Was a new medication for pain started by our clinic at the last visit? No Is the patient on a blood thinner (including aspirin, Goody/BC/Bayer powder)? No   Review of Systems:  ROS was performed with positive/negative pertinent findings listed in HPI.  Electronically signed by: Alana Search, PA-C 01/16/2024  Past Medical History: Medical History[1]    Past Surgical History: Surgical History[2]   Family History: Family History[3]   Social History: Social History[4]    No Known Allergies  Current Medications: Current Medications[5]  ____________________________________________________________________  Physical Exam:   Vitals:   01/16/24 1329  BP: 109/74  Pulse: 81  SpO2: 98%   Constitutional: Well developed, Well nourished, No acute distress and Interactive General: Patient is alert and orientated Head: normocephalic and atraumatic  Respiratory: non labored breathing  Psychological: The patient's mood is normal, and appropriate for the circumstances.  Neuro/Musculoskeletal Exam:  Spine: The spine is straight.   Gait:  normal Uses assist device - no assistive device  _____________________________________________________________________  Controlled Substance Monitoring:  NCPDMP:  Reviewed today  CTE Measures: Is the patient taking prescription opioids? No __________________________________________________________________   Labs: Relevant Labs Reviewed: Below labs reviewed, CBC, BMP/CMP/Cr, and A1c  Lab Results  Component Value Date   CREATININE 1.07 11/12/2023   EGFR 73 11/12/2023    Lab Results  Component  Value Date   WBC 7.90 11/12/2023   HGB 14.5 11/12/2023   HCT 42.0 11/12/2023   MCV 91.2 11/12/2023   PLT 208 11/12/2023    Lab Results  Component Value Date   HGBA1C 5.4 11/12/2023     No results found for: AMPHUR, BARBUR, BENUNUM, BENZUR, COCAINEUR, OPIATEUR,  THCUR  --------------------------------------------------------------------------------------------------------------------  Diagnostic Studies:  All applicable diagnostic imaging related to this evaluation have been reviewed.   MRI CERVICAL SPINE WITHOUT CONTRAST 01/23/2021   TECHNIQUE: Multiplanar, multisequence MR imaging of the cervical spine was performed. No intravenous contrast was administered.   COMPARISON:  MRI of the cervical spine January 15, 2016.   FINDINGS: Alignment: Straightening of the cervical curvature. Small 2 over C3.   Vertebrae: No fracture, evidence of discitis, or bone lesion.   Cord: Mass effect on the cord at C3-4 without cord signal, progressed from prior MRI.   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   Disc levels:   C2-3: Facet degenerative changes, left greater than right, resulting in moderate left neural foraminal narrowing, progressed from prior MRI. No significant spinal canal stenosis.   C3-4: Posterior disc osteophyte complex resulting in moderate spinal canal stenosis with mass effect on the cord without cord signal abnormality. Uncovertebral and facet degenerative changes resulting in severe bilateral neural foraminal narrowing. Findings are mildly progressed since prior MRI.   C4-5: Posterior disc osteophyte complex resulting in mild spinal canal stenosis. Uncovertebral and facet degenerative changes resulting in moderate right and severe left neural foraminal narrowing. No significant change from prior.   C5-6: Posterior disc osteophyte complex resulting mild spinal canal stenosis. Uncovertebral and facet degenerative changes resulting in moderate right and severe left neural foraminal narrowing. No significant change from prior.   C6-7: Posterior disc osteophyte complex resulting in mild spinal canal stenosis. Vertebral facet changes resulting in mild  to moderate bilateral neural foraminal narrowing. No significant change from prior.   C7-T1: No significant spinal canal neural foraminal stenosis.   IMPRESSION: 1. Mild progression of degenerative changes at C3-4 where there is moderate spinal canal stenosis with mass effect on the cord without cord signal abnormality and severe bilateral neural foraminal narrowing. 2. Mild spinal canal stenosis at C4-5, C5-6 and C6-7. 3. Moderate left neural foraminal narrowing at C2-3. 4. Moderate right and severe left neural foraminal narrowing at C4-5 and C5-6 area 5. Mild-to-moderate bilateral neural foraminal narrowing at C6-7.   ___________________________________________________________________     Diagnosis:  1. Cervical spondylosis (Primary)  2. Chronic pain syndrome  3. Bilateral occipital neuralgia   A/P 01/16/2024:  Keith Buckalew. is a 73 y.o. male being evaluated for continued management of his chronic pain.  He appears to be in no acute physical distress. He has a PMH significant for HLD, prior right rotator cuff surgery, bilateral carpal tunnel release, cervicalgia, cervical facet arthropathy, and neuropathic pain/paresthesias in bilateral upper extremities (thought to be secondary to neuropathic pain after carpal tunnel release surgery).  He is status post bilateral C3-C5 radiofrequency ablations conducted on 12/09/2023 which he cites greater than 80% relief.  He is very pleased with the response of the procedure.  He states that he feels like not only the pain in his neck has improved but he has some noticeable improvement in the numbness in his hands and improvement in his grip strength and the procedure.  He cites still being very active.  He would like to maintain the level of activity is much  as possible.  He would like to hold off any surgical intervention as long as possible.  He states that if the injections are helping to maintain him as wonderful as it is now he will  continue with them until the time, for surgery.  He is encouraged to continue with exercise and light stretching.  I also recommend massage and heat therapy over the shoulder muscles to help with localized pain relief.  He voiced understanding.  He will follow-up with us  in the next 3 to 6 months or sooner if necessary.  He will call if any questions or concerns in regards to his chronic pain prior to then..   ____________________________________________________________________  Recommended Plan of Care:  - Interventional Procedures: - Prior: Bilat C3-C5 MBB on 03/29/2021 & 05/03/2021 by Dr. Tobie - Prior: C3-C5 RFA on 06/01/2021 (left) & 07/06/2021 (right)  by Dr. Debbrah - 80% alleviation  - Prior: Bilateral greater occipital nerve block on 09/05/2021 by Dr. Tobie  - Prior: Bilateral greater occipital nerve block on 06/11/2023 by Dr. Tobie  ~ 80% relief - Prior: Bilat C3-C5 RFA on 07/03/2023 by Dr. Tobie ~ 70% relief reported 08/06/2023 - Prior: Bilat C3-C5 RFA on 12/09/2023 by Dr. Tobie ~ 80% relief reported on 01/16/2024 - Consider: SPR PNS at the median nerve bilaterally   - Medical Modalities:  - no changes  - Imaging/Labs:  - none ordered  - Consults/Therapy:  - HEP  - Follow Up: - Return in about 3 months (around 04/17/2024) for Follow up.   Treatment plan fully discussed and agreed upon with patient. All questions were answered. The patient was told to contact our clinic with questions/concerns and to report to the Emergency Room if any concerning symptoms develop.  The patient expressed understanding and all questions were answered.  Given the patient's complex chronic pain symptoms and medical history, the patient will require continued follow-up in a longitudinal fashion.   Medical decision making for this patient was moderately complex given the independent interpretation of imaging and lab results, review of relevant records from other healthcare providers,  the  severity/progression of their condition, and the risk of complication and/or morbidity that may exist with or without treatment.   Time (>19 minutes) was spent on reviewing patient PMH, medications, procedures and image,  and counseling and educating patient.  Documentation on day of service.  Electronically signed by: Alana Search, PA-C 01/16/2024       [1] Past Medical History: Diagnosis Date  . Cervical disc disorder with radiculopathy of cervical region 02/07/2015   Last Assessment & Plan:  Known moderate foraminal stenosis at C6-7 level on the right.  Start with prednisone  dose pack, heat for spasms.  Start range of motion exercises.  Has done physical therapy already.  Call us  in 1-2 weeks.  If not improving would consider MRI or injection.  . Cervical nerve root impingement 11/24/2015  . Encounter for hepatitis C screening test for low risk patient 01/02/2016  . Insect bite of leg, right 12/26/2015  . Mixed hyperlipidemia 01/29/2016  . Neck pain 11/24/2015  . Numbness of right hand 11/24/2015  . Situational depression 12/26/2015  . Welcome to Medicare preventive visit 01/02/2016  [2] Past Surgical History: Procedure Laterality Date  . CATARACT EXTRACTION Bilateral    Procedure: CATARACT EXTRACTION  . CATARACT EXTRACTION Right ?   Procedure: YAG CAPSULOTOMY; Dr. Camillo  . CATARACT EXTRACTION Left 06-03-17 MET   Procedure: YAG CAPSULOTOMY  . CERVICAL MEDIAL BRANCH BLOCK Bilateral 03/29/2021  Procedure: CERVICAL MEDIAL BRANCH BLOCK C3-5 bilateral #1/2;  Surgeon: Janus Von Adora Blanch, MD;  Location: [facility];  Service: Pain Services;  Laterality: Bilateral;  . CERVICAL MEDIAL BRANCH BLOCK Bilateral 05/03/2021   Procedure: CERVICAL MEDIAL BRANCH BLOCK BILATERAL C3-5 #2/2;  Surgeon: Janus Von Adora Blanch, MD;  Location: [facility];  Service: Pain Services;  Laterality: Bilateral;  . LUMBAR FUSION     Procedure: LUMBAR FUSION  . OTHER SURGICAL HISTORY  Bilateral 2009?   Procedure: OTHER SURGICAL HISTORY (Clear lens extraction); Dr. Camillo  . RADIOFREQUENCY ABLATION NERVES Left 06/01/2021   Procedure: RADIOFREQUENCY MEDIAL DENERVATION CERVICAL  LEFT C3-5 #1/2;  Surgeon: Janus Von Adora Blanch, MD;  Location: [facility];  Service: Pain Services;  Laterality: Left;  . RADIOFREQUENCY ABLATION NERVES Right 07/06/2021   Procedure: RADIOFREQUENCY MEDIAL DENERVATION CERVICAL  C3-C5 RIGHT #2/2;  Surgeon: Toribio Fairy Badder, MD;  Location: [facility];  Service: Pain Services;  Laterality: Right;  . RHIZOTOMY W/ RADIOFREQUENCY ABLATION Bilateral 07/03/2023   RHIZOTOMY FACET RADIOFREQUENCY C3-5 BILATERAL RFA, #1/1 performed by Janus Blanch, MD at [facility]  . RHIZOTOMY W/ RADIOFREQUENCY ABLATION Bilateral 12/09/2023   RHIZOTOMY FACET RADIOFREQUENCY BILAT C3-C5 #1/1 performed by Janus Blanch, MD at [facility]  . SHOULDER SURGERY Right   . STRABISMUS SURGERY     Procedure: STRABISMUS SURGERY  [3] Family History Problem Relation Name Age of Onset  . Stroke Mother    . Glaucoma Neg Hx    . Macular degeneration Neg Hx    [4] Social History Socioeconomic History  . Marital status: Divorced  Tobacco Use  . Smoking status: Never  . Smokeless tobacco: Never  Vaping Use  . Vaping status: Never Used  Substance and Sexual Activity  . Alcohol use: Yes    Alcohol/week: 0.0 - 80.0 standard drinks of alcohol    Comment: 2-4 beers times per week  . Drug use: Never   Social Drivers of Health   Food Insecurity: Low Risk  (09/05/2022)   Food vital sign   . Within the past 12 months, you worried that your food would run out before you got money to buy more: Never true   . Within the past 12 months, the food you bought just didn't last and you didn't have money to get more: Never true  Transportation Needs: No Transportation Needs (09/05/2022)   Transportation   . In the past 12 months, has lack of reliable transportation kept you from  medical appointments, meetings, work or from getting things needed for daily living? : No  Safety: Low Risk  (11/07/2023)   Safety   . How often does anyone, including family and friends, physically hurt you?: Never   . How often does anyone, including family and friends, insult or talk down to you?: Never   . How often does anyone, including family and friends, threaten you with harm?: Never   . How often does anyone, including family and friends, scream or curse at you?: Never  Living Situation: Low Risk  (09/05/2022)   Living Situation   . What is your living situation today?: I have a steady place to live  [5] Current Outpatient Medications  Medication Sig Dispense Refill  . ascorbic acid (VITAMIN C) 500 mg tablet Take 500 mg by mouth daily.    SABRA atorvastatin (LIPITOR) 20 mg tablet Take 1 tablet (20 mg total) by mouth daily. 90 tablet 3  . buPROPion (WELLBUTRIN XL) 150  mg 24 hr tablet Take 1 tablet (150 mg total) by mouth daily. 90 tablet 3  . cholecalciferol (VITAMIN D3) 1,000 unit (25 mcg) tablet Take 1 each (1,000 Units total) by mouth daily. 90 tablet 1  . DULoxetine (CYMBALTA) 60 mg capsule Take 1 capsule (60 mg total) by mouth daily. 90 capsule 3  . metoprolol succinate (TOPROL XL) 25 mg 24 hr tablet Take 0.5 tablets (12.5 mg total) by mouth daily. 45 tablet 3  . sildenafiL  (VIAGRA ) 100 mg tablet Take 1 tablet (100 mg total) by mouth daily as needed for erectile dysfunction. 30 tablet 2   No current facility-administered medications for this visit.

## 2024-01-16 NOTE — Telephone Encounter (Signed)
 [facility]  [facility]  Established Patient Screening Note  Prabhav Faulkenberry. is a 73 y.o. old male presenting for return patient visit.  Pain Description: Since the last visit the pain is improved. The most significant location of pain is the neck. Other areas of pain include the bilateral shoulders and bilateral hands.  The 1-2 words that best describe the pain: shooting The pain improves with radiofrequency ablation, heat therapy, and cold therapy. The pain is made worse with reaching above head. The average daily pain score is 5/10. The pain can be as high as 8/10 at its worst. The pain interferes with: Nothing.  Therapies: Did the patient have an injection/procedure since the last visit? Yes, Bilateral Cervical Radiofrequency Denervation of the Medial Branch Nerves on 12/09/23  - Percentage of pain relief from injection/procedure: 80%  Has the patient had any new imaging (X-ray, MRI, CT) for pain concerns since the last visit? No  Has physical therapy been initiated or completed for this pain concern? No  Medications: Was a new medication for pain started by our clinic at the last visit? No  Is the patient on a blood thinner (including aspirin, Goody/BC/Bayer powder)? No

## 2024-01-26 ENCOUNTER — Emergency Department (HOSPITAL_BASED_OUTPATIENT_CLINIC_OR_DEPARTMENT_OTHER)

## 2024-01-26 ENCOUNTER — Emergency Department (HOSPITAL_BASED_OUTPATIENT_CLINIC_OR_DEPARTMENT_OTHER)
Admission: EM | Admit: 2024-01-26 | Discharge: 2024-01-26 | Disposition: A | Discharge status: Home / Self Care | Attending: Emergency Medicine | Admitting: Emergency Medicine

## 2024-01-26 ENCOUNTER — Other Ambulatory Visit: Payer: Self-pay

## 2024-01-26 ENCOUNTER — Encounter (HOSPITAL_BASED_OUTPATIENT_CLINIC_OR_DEPARTMENT_OTHER): Payer: Self-pay | Admitting: *Deleted

## 2024-01-26 DIAGNOSIS — M79604 Pain in right leg: Secondary | ICD-10-CM | POA: Insufficient documentation

## 2024-01-26 MED ORDER — LIDOCAINE 5 % EX PTCH: 1.0000 | MEDICATED_PATCH | CUTANEOUS | 0 refills | Status: AC

## 2024-01-26 MED ORDER — METHOCARBAMOL 500 MG PO TABS: 500.0000 mg | ORAL_TABLET | Freq: Two times a day (BID) | ORAL | 0 refills | Status: AC

## 2024-01-26 NOTE — ED Provider Notes (Signed)
 [facility] Provider Note   CSN: [id number] Arrival date & time: 01/26/24  1548    Patient presents with: Leg Pain   Keith Bruce is a 73 y.o. male here for evaluation of right leg pain.  Started on Thursday.  Noted he had an aching to the distal posterior lateral femur into the right lateral aspect of his knee.  Has been doing Tylenol , ice and elevating.  States today he stood up from the couch when he felt a pop sensation to the lateral aspect of his knee.  Pain worse with range of motion. No back pain, abdominal pain, chest pain, shortness of breath, numbness, weakness.  No history of PE or DVT.  States he had something previously in the left leg which he ruptured a muscle. No hx of AAA or dissection.   HPI     Prior to Admission medications   Medication Sig Start Date End Date Taking? Authorizing Provider  atorvastatin (LIPITOR) 20 MG tablet Take 20 mg by mouth daily. 11/14/18  Yes [provider]  buPROPion (WELLBUTRIN XL) 150 MG 24 hr tablet Take 150 mg by mouth daily. 12/08/18  Yes [provider]  DULoxetine (CYMBALTA) 60 MG capsule Take 60 mg by mouth daily. 01/18/22  Yes [provider]  lidocaine (LIDODERM) 5 % Place 1 patch onto the skin daily. Remove & Discard patch within 12 hours or as directed by MD 01/26/24  Yes Twan Harkin A, PA-C  methocarbamol (ROBAXIN) 500 MG tablet Take 1 tablet (500 mg total) by mouth 2 (two) times daily. 01/26/24  Yes Wilda Wetherell A, PA-C  metoprolol succinate (TOPROL-XL) 25 MG 24 hr tablet Take 12.5 mg by mouth daily. 11/12/23 11/11/24 Yes [provider]  sildenafil  (VIAGRA ) 100 MG tablet Take 100 mg by mouth daily as needed for erectile dysfunction. 08/06/23 08/05/24 Yes [provider]  pantoprazole  (PROTONIX ) 40 MG tablet Take 1 tablet (40 mg total) by mouth daily for 14 days. 10/10/20 10/24/20  Roselyn Carlin NOVAK, MD    Allergies: Patient has no  known allergies.    Review of Systems  Constitutional: Negative.   HENT: Negative.    Respiratory: Negative.    Cardiovascular: Negative.   Gastrointestinal: Negative.   Genitourinary: Negative.   Musculoskeletal:        Right leg pain  Skin: Negative.   Neurological: Negative.   All other systems reviewed and are negative.   Updated Vital Signs BP 126/79 (BP Location: Left Arm)   Pulse 89   Temp 98.2 F (36.8 C)   Resp 20   Wt 104.3 kg   SpO2 96%   BMI 32.08 kg/m   Physical Exam Vitals and nursing note reviewed.  Constitutional:      General: He is not in acute distress.    Appearance: He is well-developed. He is not ill-appearing, toxic-appearing or diaphoretic.  HENT:     Head: Normocephalic and atraumatic.     Nose: Nose normal.     Mouth/Throat:     Mouth: Mucous membranes are moist.  Eyes:     Pupils: Pupils are equal, round, and reactive to light.  Cardiovascular:     Rate and Rhythm: Normal rate and regular rhythm.     Pulses: Normal pulses.          Radial pulses are 2+ on the right side and 2+ on the left side.       Dorsalis pedis pulses are 2+ on the  right side and 2+ on the left side.     Heart sounds: Normal heart sounds.  Pulmonary:     Effort: Pulmonary effort is normal. No respiratory distress.     Breath sounds: Normal breath sounds.  Abdominal:     General: Bowel sounds are normal. There is no distension.     Palpations: Abdomen is soft.     Tenderness: There is no abdominal tenderness. There is no right CVA tenderness, left CVA tenderness, guarding or rebound.  Musculoskeletal:        General: Normal range of motion.     Cervical back: Normal range of motion and neck supple.     Comments: Tenderness to lateral aspect to right knee lateral joint line. Unable to straight leg raise due to pain. Tenderness proximal lateral tib head on right leg.  Calves soft, nontender.  Compartment soft.  Negative Thompson test, Homans test, negative valgus  stress, varus stress.  Negative anterior drawer  Skin:    General: Skin is warm and dry.  Neurological:     General: No focal deficit present.     Mental Status: He is alert and oriented to person, place, and time.     (all labs ordered are listed, but only abnormal results are displayed) Labs Reviewed - No data to display  EKG: None  Radiology: US  Venous Img Lower Right (DVT Study) Result Date: 01/26/2024 EXAM: ULTRASOUND DUPLEX OF THE RIGHT LOWER EXTREMITY VEINS TECHNIQUE: Duplex ultrasound using B-mode/gray scaled imaging and Doppler spectral analysis and color flow was obtained of the deep venous structures of the right lower extremity. COMPARISON: None. CLINICAL HISTORY: Rt leg/pop pain FINDINGS: The common femoral vein, femoral vein, popliteal vein, and posterior tibial vein of the right lower extremity demonstrate normal compressibility with normal color flow and spectral analysis. IMPRESSION: 1. No evidence of DVT. Electronically signed by: Greig Pique MD 01/26/2024 05:47 PM EDT RP Workstation: [id number]   DG Tibia/Fibula Right Result Date: 01/26/2024 CLINICAL DATA:  Right leg pain. EXAM: RIGHT TIBIA AND FIBULA - 2 VIEW COMPARISON:  None Available. FINDINGS: There is no evidence of fracture or other focal bone lesions. Knee and ankle alignment is maintained. No erosions or periostitis. Plantar calcaneal spur and Achilles tendon enthesophyte. Soft tissues are unremarkable. IMPRESSION: 1. No acute findings. 2. Plantar calcaneal spur and Achilles tendon enthesophyte. Electronically Signed   By: Andrea Gasman M.D.   On: 01/26/2024 17:09     .Ortho Injury Treatment  Date/Time: 01/26/2024 6:45 PM  Performed by: Edie Einstein A, PA-C Authorized by: Edie Einstein LABOR, PA-C   Consent:    Consent obtained:  Verbal   Consent given by:  Patient   Risks discussed:  Fracture, nerve damage, restricted joint movement, vascular damage, stiffness, recurrent dislocation and  irreducible dislocation   Alternatives discussed:  No treatment, alternative treatment, immobilization, referral and delayed treatmentInjury location: knee Location details: right knee Injury type: soft tissue Pre-procedure neurovascular assessment: neurovascularly intact Pre-procedure distal perfusion: normal Pre-procedure neurological function: normal Pre-procedure range of motion: normal  Anesthesia: Local anesthesia used: no  Patient sedated: NoImmobilization: crutches and brace Splint type: knee immobilizer. Splint Applied by: ED Tech Post-procedure neurovascular assessment: post-procedure neurovascularly intact Post-procedure distal perfusion: normal Post-procedure neurological function: normal Post-procedure range of motion: normal     73 here for evaluation of right leg pain.  Has had aching over the last 4 days to right lateral aspect knee and very distal lateral aspect femur.  No history of PE or DVT.  Has been doing Tylenol , ice and ibuprofen .  Stood up from chair today and he felt a pop sensation to the lateral aspect of his knee states his pain radiates into his proximal lateral tibia as well as right posterior lateral aspect femur.  He is unable to straight leg raise due to pain.  Here he is neurovascular intact.  His compartments are soft.  He denies any back pain.  No history of AAA, dissection.  He has no obvious edema, erythema to suggest infectious process.  His Achilles tendon is intact.  Plan on imaging.  Imaging personally viewed and interpreted:  Ultrasound negative for DVT X-ray without fracture, dislocation, effusion  Discussed results with patient.  I suspect tendon, ligament injury.  He is placed in the immobilizer, crutches.  Discussed RICE symptomatic management.  Close follow-up with PCP and orthopedics.  Considered septic joint, gout, hemarthrosis, VTE, ischemia/arterial occlusion, AAA, dissection, bacterial infectious process, occult fracture,  dislocation, necrotizing infection/cellulitis, abscess, perforation, compartment syndrome, radiculopathy such as cauda equina, discitis, osteomyelitis, transverse myelitis however based off history, presentation, imaging and low suspicion for above.  The patient has been appropriately medically screened and/or stabilized in the ED. I have low suspicion for any other emergent medical condition which would require further screening, evaluation or treatment in the ED or require inpatient management.  Patient is hemodynamically stable and in no acute distress.  Patient able to ambulate in department prior to ED.  Evaluation does not show acute pathology that would require ongoing or additional emergent interventions while in the emergency department or further inpatient treatment.  I have discussed the diagnosis with the patient and answered all questions.  Pain is been managed while in the emergency department and patient has no further complaints prior to discharge.  Patient is comfortable with plan discussed in room and is stable for discharge at this time.  I have discussed strict return precautions for returning to the emergency department.  Patient was encouraged to follow-up with PCP/specialist refer to at discharge.     Medications Ordered in the ED - No data to display                                  Medical Decision Making Amount and/or Complexity of Data Reviewed External Data Reviewed: labs, radiology and notes. Radiology: ordered and independent interpretation performed. Decision-making details documented in ED Course.  Risk OTC drugs. Prescription drug management. Decision regarding hospitalization. Diagnosis or treatment significantly limited by social determinants of health.       Final diagnoses:  Right leg pain    ED Discharge Orders          Ordered    methocarbamol (ROBAXIN) 500 MG tablet  2 times daily        01/26/24 1856    lidocaine (LIDODERM) 5 %  Every 24  hours        01/26/24 1856               Aitana Burry A, PA-C 01/26/24 1858    Ruthe Cornet, DO 01/26/24 1859

## 2024-01-26 NOTE — Discharge Instructions (Signed)
 It was a pleasure taking care of you here today  As discussed your x-ray and ultrasound are reassuring  You possibly injured a tendon or ligament  I have placed you in a knee immobilizer to help with pain given you some crutches to get around.  Make sure to ice, elevate.  I have given you muscle relaxers please use caution as this may make you sleepy.  Follow-up with primary care provider or orthopedics in the next 48 hours  Return for new or worsening symptoms.

## 2024-01-26 NOTE — ED Triage Notes (Signed)
 Pt is here for evaluation of right leg pain which began Thursday.  He states that he began having pain in right calf and behind right knee which began Thursday.  This was not associated with any trauma.  Pt states that an hour ago he heard a pop while standing followed by severe pain in calf.  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
